# Patient Record
Sex: Female | Born: 1990
Health system: Southern US, Community
[De-identification: ages and names within clinical notes are randomized; demographics above are authoritative.]

## PROBLEM LIST (undated history)

## (undated) DIAGNOSIS — F419 Anxiety disorder, unspecified: Secondary | ICD-10-CM

## (undated) DIAGNOSIS — T7840XA Allergy, unspecified, initial encounter: Secondary | ICD-10-CM

## (undated) DIAGNOSIS — F988 Other specified behavioral and emotional disorders with onset usually occurring in childhood and adolescence: Secondary | ICD-10-CM

## (undated) DIAGNOSIS — F32A Depression, unspecified: Secondary | ICD-10-CM

## (undated) HISTORY — PX: FRACTURE SURGERY: SHX138

## (undated) HISTORY — DX: Depression, unspecified: F32.A

## (undated) HISTORY — DX: Allergy, unspecified, initial encounter: T78.40XA

---
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
## 2011-04-24 LAB — URINALYSIS, ROUTINE W REFLEX MICROSCOPIC
Bilirubin Urine: NEGATIVE
Glucose, UA: NEGATIVE
Hgb urine dipstick: NEGATIVE
Ketones, ur: NEGATIVE
Nitrite: NEGATIVE
Protein, ur: NEGATIVE
Specific Gravity, Urine: 1.005
Urobilinogen, UA: 0.2
pH: 6

## 2011-04-24 LAB — COMPREHENSIVE METABOLIC PANEL
BUN: 10
CO2: 26
Chloride: 107
Creatinine, Ser: 0.69
Total Bilirubin: 0.9

## 2011-04-24 LAB — CBC
HCT: 39.2
Hemoglobin: 13.3
MCV: 86.8
RBC: 4.51
WBC: 10.2

## 2011-04-24 LAB — DIFFERENTIAL
Basophils Absolute: 0
Basophils Relative: 0
Eosinophils Relative: 1
Lymphocytes Relative: 27
Neutro Abs: 6.8

## 2011-04-24 LAB — URINE CULTURE: Colony Count: NO GROWTH

## 2011-05-12 LAB — POCT HEMOGLOBIN-HEMACUE: Operator id: 123881

## 2015-12-18 DIAGNOSIS — L259 Unspecified contact dermatitis, unspecified cause: Secondary | ICD-10-CM | POA: Diagnosis not present

## 2016-01-21 DIAGNOSIS — Z6834 Body mass index (BMI) 34.0-34.9, adult: Secondary | ICD-10-CM | POA: Diagnosis not present

## 2016-01-21 DIAGNOSIS — Z01419 Encounter for gynecological examination (general) (routine) without abnormal findings: Secondary | ICD-10-CM | POA: Diagnosis not present

## 2016-01-21 DIAGNOSIS — Z124 Encounter for screening for malignant neoplasm of cervix: Secondary | ICD-10-CM | POA: Diagnosis not present

## 2016-02-11 DIAGNOSIS — T7840XA Allergy, unspecified, initial encounter: Secondary | ICD-10-CM | POA: Diagnosis not present

## 2016-03-20 DIAGNOSIS — L299 Pruritus, unspecified: Secondary | ICD-10-CM | POA: Diagnosis not present

## 2016-03-20 DIAGNOSIS — X58XXXA Exposure to other specified factors, initial encounter: Secondary | ICD-10-CM | POA: Diagnosis not present

## 2016-03-20 DIAGNOSIS — Z720 Tobacco use: Secondary | ICD-10-CM | POA: Diagnosis not present

## 2016-03-20 DIAGNOSIS — T7840XA Allergy, unspecified, initial encounter: Secondary | ICD-10-CM | POA: Diagnosis not present

## 2016-03-20 DIAGNOSIS — H578 Other specified disorders of eye and adnexa: Secondary | ICD-10-CM | POA: Diagnosis not present

## 2016-03-20 DIAGNOSIS — T783XXA Angioneurotic edema, initial encounter: Secondary | ICD-10-CM | POA: Diagnosis not present

## 2016-03-20 DIAGNOSIS — R220 Localized swelling, mass and lump, head: Secondary | ICD-10-CM | POA: Diagnosis not present

## 2016-04-12 ENCOUNTER — Emergency Department (HOSPITAL_COMMUNITY)
Admission: EM | Admit: 2016-04-12 | Discharge: 2016-04-13 | Disposition: A | Discharge status: Home / Self Care | Payer: BLUE CROSS/BLUE SHIELD | Attending: Emergency Medicine | Admitting: Emergency Medicine

## 2016-04-12 ENCOUNTER — Encounter (HOSPITAL_COMMUNITY): Payer: Self-pay | Admitting: Emergency Medicine

## 2016-04-12 DIAGNOSIS — F17210 Nicotine dependence, cigarettes, uncomplicated: Secondary | ICD-10-CM | POA: Insufficient documentation

## 2016-04-12 DIAGNOSIS — T7840XA Allergy, unspecified, initial encounter: Secondary | ICD-10-CM

## 2016-04-12 MED ORDER — METHYLPREDNISOLONE SODIUM SUCC 125 MG IJ SOLR
125.0000 mg | Freq: Once | INTRAMUSCULAR | Status: AC
  Administered 2016-04-12: 125 mg via INTRAVENOUS
  Filled 2016-04-12: qty 2

## 2016-04-12 MED ORDER — FAMOTIDINE IN NACL 20-0.9 MG/50ML-% IV SOLN
20.0000 mg | Freq: Once | INTRAVENOUS | Status: AC
  Administered 2016-04-12: 20 mg via INTRAVENOUS
  Filled 2016-04-12: qty 50

## 2016-04-12 MED ORDER — DIPHENHYDRAMINE HCL 50 MG/ML IJ SOLN
25.0000 mg | Freq: Once | INTRAMUSCULAR | Status: AC
  Administered 2016-04-12: 25 mg via INTRAVENOUS
  Filled 2016-04-12: qty 1

## 2016-04-12 MED ORDER — PREDNISONE 50 MG PO TABS: ORAL_TABLET | ORAL | 0 refills | Status: DC

## 2016-04-12 NOTE — Discharge Instructions (Signed)
You were given Solu-Medrol 125 mg IV, Benadryl 25 mg IV, Pepcid 20 mg IV.   Discharge medication prednisone 50 mg daily. Call the allergist Monday and discuss your situation.

## 2016-04-12 NOTE — ED Triage Notes (Signed)
Pt here with unknown allergic rxn starting tonight fter dinner. Pt took 75mg  benadryl PTA. Pt has hives and facial swelling. Airway intact, no edema to throat. Resp e/u. Pt has allergy test Thursday.

## 2016-04-12 NOTE — ED Provider Notes (Signed)
[facility] Provider Note   CSN: [id number] Arrival date & time: 04/12/16  2220     History   Chief Complaint Chief Complaint  Patient presents with  . Allergic Reaction    HPI ANYIA GIERKE is a 25 y.o. female.  Lip swelling, facial swelling, pruritic rash to extremities for several hours.  Patient had a similar episode approximately 2 weeks ago requiring ED treatment. She has an appointment with an allergist on Thursday. This has never happened before. She is swallowing normally and breathing without excessive exertion. No known allergens or inciting events.       History reviewed. No pertinent past medical history.  There are no active problems to display for this patient.   History reviewed. No pertinent surgical history.  OB History    No data available       Home Medications    Prior to Admission medications   Medication Sig Start Date End Date Taking? Authorizing Provider  EPINEPHrine 0.3 mg/0.3 mL IJ SOAJ injection Inject 0.3 mg into the muscle daily as needed (allergic reaction).   Yes Historical Provider, MD  norethindrone-ethinyl estradiol (JUNEL FE,GILDESS FE,LOESTRIN FE) 1-20 MG-MCG tablet Take 1 tablet by mouth daily.   Yes Historical Provider, MD  predniSONE (DELTASONE) 50 MG tablet 1 tablet daily for 7 days 04/12/16   Donnetta Hutching, MD    Family History History reviewed. No pertinent family history.  Social History Social History  Substance Use Topics  . Smoking status: Current Every Day Smoker    Packs/day: 0.50    Types: Cigarettes  . Smokeless tobacco: Never Used  . Alcohol use Yes     Comment: occasional     Allergies   Nsaids   Review of Systems Review of Systems  All other systems reviewed and are negative.    Physical Exam Updated Vital Signs BP 115/76   Pulse 94   Temp 98.1 F (36.7 C) (Oral)   Resp 20   Ht 5\' 5"  (1.651 m)   Wt 190 lb (86.2 kg)   LMP 04/10/2016   SpO2 99%   BMI 31.62 kg/m   Physical  Exam  Constitutional: She is oriented to person, place, and time. She appears well-developed and well-nourished.  HENT:  Head: Normocephalic and atraumatic.  Lips are edematous.  Normal airway.  Eyes: Conjunctivae are normal.  Neck: Neck supple.  Cardiovascular: Normal rate and regular rhythm.   Pulmonary/Chest: Effort normal and breath sounds normal.  Abdominal: Soft. Bowel sounds are normal.  Musculoskeletal: Normal range of motion.  Neurological: She is alert and oriented to person, place, and time.  Skin: Skin is warm and dry.  Wheals on skin  Psychiatric: She has a normal mood and affect. Her behavior is normal.  Nursing note and vitals reviewed.    ED Treatments / Results  Labs (all labs ordered are listed, but only abnormal results are displayed) Labs Reviewed - No data to display  EKG  EKG Interpretation None       Radiology No results found.  Procedures Procedures (including critical care time)  Medications Ordered in ED Medications  famotidine (PEPCID) IVPB 20 mg premix (20 mg Intravenous New Bag/Given 04/12/16 2300)  diphenhydrAMINE (BENADRYL) injection 25 mg (25 mg Intravenous Given 04/12/16 2257)  methylPREDNISolone sodium succinate (SOLU-MEDROL) 125 mg/2 mL injection 125 mg (125 mg Intravenous Given 04/12/16 2258)     Initial Impression / Assessment and Plan / ED Course  I have reviewed the triage vital signs and the  nursing notes.  Pertinent labs & imaging results that were available during my care of the patient were reviewed by me and considered in my medical decision making (see chart for details).  Clinical Course    Patient had good response to IV Solu-Medrol, IV Pepcid, IV Benadryl. Discharge medication prednisone 50 mg. She will follow-up with allergist.  Final Clinical Impressions(s) / ED Diagnoses   Final diagnoses:  Allergic reaction, initial encounter    New Prescriptions New Prescriptions   PREDNISONE (DELTASONE) 50 MG TABLET    1  tablet daily for 7 days     Donnetta HutchingBrian Alice Vitelli, MD 04/12/16 2351

## 2016-04-13 NOTE — ED Notes (Signed)
Pt verbalized understanding of d/cf instructions and has no further questions. Pt's airway intact, breath sounds clear, lip swelling has gone down, hives are gone. Pt to take prednisone once per day and then call the allergist on Monday to discuss situation about allergy testing appointment Thursday.

## 2016-04-17 DIAGNOSIS — T783XXD Angioneurotic edema, subsequent encounter: Secondary | ICD-10-CM | POA: Diagnosis not present

## 2016-04-17 DIAGNOSIS — L508 Other urticaria: Secondary | ICD-10-CM | POA: Diagnosis not present

## 2016-10-12 DIAGNOSIS — T7840XA Allergy, unspecified, initial encounter: Secondary | ICD-10-CM | POA: Diagnosis not present

## 2016-10-16 DIAGNOSIS — T783XXD Angioneurotic edema, subsequent encounter: Secondary | ICD-10-CM | POA: Diagnosis not present

## 2016-10-16 DIAGNOSIS — L508 Other urticaria: Secondary | ICD-10-CM | POA: Diagnosis not present

## 2017-01-06 LAB — HM PAP SMEAR

## 2017-01-06 LAB — HIV ANTIBODY (ROUTINE TESTING W REFLEX): HIV 1&2 Ab, 4th Generation: NEGATIVE

## 2017-01-22 DIAGNOSIS — T783XXD Angioneurotic edema, subsequent encounter: Secondary | ICD-10-CM | POA: Diagnosis not present

## 2017-01-22 DIAGNOSIS — Z87892 Personal history of anaphylaxis: Secondary | ICD-10-CM | POA: Diagnosis not present

## 2017-01-22 DIAGNOSIS — Z01419 Encounter for gynecological examination (general) (routine) without abnormal findings: Secondary | ICD-10-CM | POA: Diagnosis not present

## 2017-01-22 DIAGNOSIS — Z6835 Body mass index (BMI) 35.0-35.9, adult: Secondary | ICD-10-CM | POA: Diagnosis not present

## 2017-01-22 DIAGNOSIS — R87610 Atypical squamous cells of undetermined significance on cytologic smear of cervix (ASC-US): Secondary | ICD-10-CM | POA: Diagnosis not present

## 2017-01-22 DIAGNOSIS — L508 Other urticaria: Secondary | ICD-10-CM | POA: Diagnosis not present

## 2017-02-04 DIAGNOSIS — R87610 Atypical squamous cells of undetermined significance on cytologic smear of cervix (ASC-US): Secondary | ICD-10-CM | POA: Diagnosis not present

## 2017-02-20 DIAGNOSIS — X58XXXA Exposure to other specified factors, initial encounter: Secondary | ICD-10-CM | POA: Diagnosis not present

## 2017-02-20 DIAGNOSIS — N939 Abnormal uterine and vaginal bleeding, unspecified: Secondary | ICD-10-CM | POA: Diagnosis not present

## 2017-02-20 DIAGNOSIS — F17210 Nicotine dependence, cigarettes, uncomplicated: Secondary | ICD-10-CM | POA: Diagnosis not present

## 2017-02-20 DIAGNOSIS — S3141XA Laceration without foreign body of vagina and vulva, initial encounter: Secondary | ICD-10-CM | POA: Diagnosis not present

## 2017-02-23 DIAGNOSIS — S3141XA Laceration without foreign body of vagina and vulva, initial encounter: Secondary | ICD-10-CM | POA: Diagnosis not present

## 2017-03-12 DIAGNOSIS — S3141XD Laceration without foreign body of vagina and vulva, subsequent encounter: Secondary | ICD-10-CM | POA: Diagnosis not present

## 2017-07-08 ENCOUNTER — Encounter: Payer: Self-pay | Admitting: Physician Assistant

## 2017-07-08 ENCOUNTER — Ambulatory Visit: Payer: BLUE CROSS/BLUE SHIELD | Admitting: Physician Assistant

## 2017-07-08 VITALS — BP 124/76 | HR 72 | Temp 97.7°F | Resp 18 | Ht 65.0 in | Wt 223.8 lb

## 2017-07-08 DIAGNOSIS — Z131 Encounter for screening for diabetes mellitus: Secondary | ICD-10-CM

## 2017-07-08 DIAGNOSIS — E559 Vitamin D deficiency, unspecified: Secondary | ICD-10-CM | POA: Diagnosis not present

## 2017-07-08 DIAGNOSIS — Z0000 Encounter for general adult medical examination without abnormal findings: Secondary | ICD-10-CM | POA: Diagnosis not present

## 2017-07-08 DIAGNOSIS — Z130 Encounter for screening for diseases of the blood and blood-forming organs and certain disorders involving the immune mechanism: Secondary | ICD-10-CM

## 2017-07-08 DIAGNOSIS — Z79899 Other long term (current) drug therapy: Secondary | ICD-10-CM | POA: Diagnosis not present

## 2017-07-08 DIAGNOSIS — F419 Anxiety disorder, unspecified: Secondary | ICD-10-CM

## 2017-07-08 DIAGNOSIS — Z23 Encounter for immunization: Secondary | ICD-10-CM | POA: Diagnosis not present

## 2017-07-08 DIAGNOSIS — R87619 Unspecified abnormal cytological findings in specimens from cervix uteri: Secondary | ICD-10-CM | POA: Insufficient documentation

## 2017-07-08 DIAGNOSIS — E0789 Other specified disorders of thyroid: Secondary | ICD-10-CM

## 2017-07-08 DIAGNOSIS — I10 Essential (primary) hypertension: Secondary | ICD-10-CM

## 2017-07-08 DIAGNOSIS — R002 Palpitations: Secondary | ICD-10-CM

## 2017-07-08 DIAGNOSIS — Z136 Encounter for screening for cardiovascular disorders: Secondary | ICD-10-CM | POA: Diagnosis not present

## 2017-07-08 DIAGNOSIS — Z720 Tobacco use: Secondary | ICD-10-CM

## 2017-07-08 DIAGNOSIS — Z13220 Encounter for screening for lipoid disorders: Secondary | ICD-10-CM

## 2017-07-08 DIAGNOSIS — Z6837 Body mass index (BMI) 37.0-37.9, adult: Secondary | ICD-10-CM

## 2017-07-08 DIAGNOSIS — R87610 Atypical squamous cells of undetermined significance on cytologic smear of cervix (ASC-US): Secondary | ICD-10-CM

## 2017-07-08 DIAGNOSIS — L509 Urticaria, unspecified: Secondary | ICD-10-CM

## 2017-07-08 MED ORDER — BUPROPION HCL ER (XL) 150 MG PO TB24: 150.0000 mg | ORAL_TABLET | ORAL | 2 refills | Status: DC

## 2017-07-08 MED ORDER — LEVOCETIRIZINE DIHYDROCHLORIDE 5 MG PO TABS: 5.0000 mg | ORAL_TABLET | Freq: Every evening | ORAL | 3 refills | Status: DC

## 2017-07-08 NOTE — Progress Notes (Signed)
Complete Physical  Assessment and Plan:  Needs flu shot -     FLU VACCINE MDCK QUAD W/Preservative  BMI 37.0-37.9, adult - follow up 3 months for progress monitoring - increase veggies, decrease carbs - long discussion about weight loss, diet, and exercise  Medication management -     CBC with Differential/Platelet -     BASIC METABOLIC PANEL WITH GFR -     Hepatic function panel -     Magnesium  Anxiety -     buPROPion (WELLBUTRIN XL) 150 MG 24 hr tablet; Take 1 tablet (150 mg total) by mouth every morning. - start new medication prescribed, stress management techniques discussed, increase water, good sleep hygiene discussed, increase exercise, and increase veggies.  call the office if any new AE's from medications and we will switch them.   Tobacco use Smoking cessation-  instruction/counseling given, counseled patient on the dangers of tobacco use, advised patient to stop smoking, and reviewed strategies to maximize success, will try Wellbutrin for cessation/anxiety  Palpitation  check labs, r/u anemia, TSH, dehydration, electrolytes imbalance  if worse will go to ER, follow up cardio Increase water, decrease alcohol/caffeine, valsalva maneuvers taught -     TSH -     Sedimentation rate -     EKG 12-Lead  Thyroid fullness -     TSH -     Sedimentation rate -     US THYROID; Future  Screening for diabetes mellitus -     Hemoglobin A1c -     Insulin, random  Screening cholesterol level -     Lipid panel  Screening, anemia, deficiency, iron -     Iron,Total/Total Iron Binding Cap -     Vitamin B12  Vitamin D deficiency -     VITAMIN D 25 Hydroxy (Vit-D Deficiency, Fractures)  Hives of unknown origin -     levocetirizine (XYZAL) 5 MG tablet; Take 1 tablet (5 mg total) by mouth every evening.    Discussed med's effects and SE's. Screening labs and tests as requested with regular follow-up as recommended. Over 40 minutes of exam, counseling, chart review, and  complex, high level critical decision making was performed this visit.   HPI  26 y.o. female  presents for a complete physical as a new patient.   Her blood pressure has been controlled at home, today their BP is BP: 124/76 She does not workout. She denies chest pain, shortness of breath, dizziness.   She is on vitamin D vitamind daily 1000 IU daily.   Had bite from [place] tick and since that time will have random hives, swelling. Carried epipen for this but has not had to use it and take continuous zyrtec.  Mother wanted her to come in today, mom states she has been very anxious, crying a lot. Has had anxiety x 1 year. Will have a lot of worry, decreased motivation, increase weight. She will get clammy, figity, and palpitations. Will take several deep breaths and will last 10 mins and go away.   Will have bilateral hand pain, has history of RA with GM and aunt.   Follows with GYN, Lyn Hurst in [place]. PAP  01/22/2017 + high rish HPV, abnormal pap ASC-US.  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   BMI is Body mass index is 37.24 kg/m., she is working on diet and exercise. Wt Readings from Last 3 Encounters:  07/08/17 223 lb 12.8 oz (101.5 kg)  04/12/16 190 lb (86.2 kg)  Current Medications:  Current Outpatient Medications on File Prior to Visit  Medication Sig Dispense Refill  . cetirizine (ZYRTEC) 10 MG tablet Take 10 mg by mouth daily.    Marland Kitchen. EPINEPHrine 0.3 mg/0.3 mL IJ SOAJ injection Inject 0.3 mg into the muscle daily as needed (allergic reaction).    . norethindrone-ethinyl estradiol (JUNEL FE,GILDESS FE,LOESTRIN FE) 1-20 MG-MCG tablet Take 1 tablet by mouth daily.     No current facility-administered medications on file prior to visit.    Allergies:  Allergies  Allergen Reactions  . Nsaids Hives and Other (See Comments)    blisters   Medical History:  She has Vitamin D deficiency; Tobacco use; Anxiety; Medication management; BMI 37.0-37.9, adult; and Abnormal Pap smear  of cervix on their problem list.   Health Maintenance:   Immunization History  Administered Date(s) Administered  . Influenza Inj Mdck Quad With Preservative 07/08/2017    Tetanus: Sept 2017 Pneumovax: Prevnar 13:  Flu vaccine: TODAY Zostavax:  Patient's last menstrual period was 06/24/2017. Pap: 12/2016 at GYN + HPV, ASCUS MGM:  DEXA: Colonoscopy: EGD:  Patient Care Team: Lucky CowboyMcKeown, William, MD as PCP - General (Internal Medicine)  Surgical History:  She has a past surgical history that includes Knee surgery (Left, 2006).   Family History:  Herfamily history includes Rheum arthritis in her maternal aunt and maternal grandmother.  States no significant family history.   Social History:  She reports that she has been smoking cigarettes.  She has a 1.25 pack-year smoking history. she has never used smokeless tobacco. She reports that she drinks about 7.2 oz of alcohol per week. She reports that she does not use drugs.  Lives with boyfriend, 3 step kids, 539 boy, girl kelsy 7, 4 boy- lives in [place]  Review of Systems: Review of Systems  Constitutional: Negative.   HENT: Negative.   Eyes: Negative.   Respiratory: Negative.   Cardiovascular: Positive for palpitations. Negative for chest pain, orthopnea, claudication, leg swelling and PND.  Gastrointestinal: Negative.   Genitourinary: Negative.   Musculoskeletal: Positive for joint pain. Negative for back pain, falls, myalgias and neck pain.  Skin: Positive for itching and rash.  Neurological: Negative.   Psychiatric/Behavioral: Negative for depression, hallucinations, memory loss, substance abuse and suicidal ideas. The patient is nervous/anxious. The patient does not have insomnia.     Physical Exam: Estimated body mass index is 37.24 kg/m as calculated from the following:   Height as of this encounter: 5\' 5"  (1.651 m).   Weight as of this encounter: 223 lb 12.8 oz (101.5 kg). BP 124/76   Pulse 72   Temp 97.7 F  (36.5 C)   Resp 18   Ht 5\' 5"  (1.651 m)   Wt 223 lb 12.8 oz (101.5 kg)   LMP 06/24/2017   SpO2 98%   BMI 37.24 kg/m  General Appearance: Well nourished, in no apparent distress.  Eyes: PERRLA, EOMs, conjunctiva no swelling or erythema, normal fundi and vessels.  Sinuses: No Frontal/maxillary tenderness  ENT/Mouth: Ext aud canals clear, normal light reflex with TMs without erythema, bulging. Good dentition. No erythema, swelling, or exudate on post pharynx. Tonsils not swollen or erythematous. Hearing normal.  Neck: Supple, thyroid fullness but no nodules. No bruits  Respiratory: Respiratory effort normal, BS equal bilaterally without rales, rhonchi, wheezing or stridor.  Cardio: RRR without murmurs, rubs or gallops. Brisk peripheral pulses without edema.  Chest: symmetric, with normal excursions and percussion.  Breasts: defer Abdomen: Soft, nontender, no guarding, rebound,  hernias, masses, or organomegaly.  Lymphatics: Non tender without lymphadenopathy.  Genitourinary: defer Musculoskeletal: Full ROM all peripheral extremities,5/5 strength, and normal gait.  Skin: Warm, dry without rashes, lesions, ecchymosis. Neuro: Cranial nerves intact, reflexes equal bilaterally. Normal muscle tone, no cerebellar symptoms. Sensation intact.  Psych: Awake and oriented X 3, normal affect, Insight and Judgment appropriate.   EKG: WNL no ST changes.   Margaret MullingAmanda Rodrigus Mckinney 11:33 AM [facility]

## 2017-07-08 NOTE — Patient Instructions (Signed)
Being dehydrated can hurt your kidneys, cause fatigue, headaches, muscle aches, joint pain, and dry skin/nails so please increase your fluids.   Drink 80-100 oz a day of water, measure it out! Eat 3 meals a day, have to do breakfast, eat protein- hard boiled eggs, protein bar like nature valley protein bar, greek yogurt like oikos triple zero, chobani 100, or light n fit greek  Can check out plantnanny app on your phone to help you keep track of your water   We want weight loss that will last so you should lose 1-2 pounds a week.  THAT IS IT! Please pick THREE things a month to change. Once it is a habit check off the item. Then pick another three items off the list to become habits.  If you are already doing a habit on the list GREAT!  Cross that item off! o Don't drink your calories. Ie, alcohol, soda, fruit juice, and sweet tea.  o Drink more water. Drink a glass when you feel hungry or before each meal.  o Eat breakfast - Complex carb and protein (likeDannon light and fit yogurt, oatmeal, fruit, eggs, [place]urkey bacon). o Measure your cereal.  Eat no more than one cup a day. (ie [place]ashi) o Eat an apple a day. o Add a vegetable a day. o Try a new vegetable a month. o Use Pam! Stop using oil or butter to cook. o Don't finish your plate or use smaller plates. o Share your dessert. o Eat sugar free Jello for dessert or frozen grapes. o Don't eat 2-3 hours before bed. o Switch to whole wheat bread, pasta, and brown rice. o Make healthier choices when you eat out. No fries! o Pick baked chicken, NOT fried. o Don't forget to SLOW DOWN when you eat. It is not going anywhere.  o Take the stairs. o Park far away in the parking lot o [employer] Farm[employer] soup cans (or weights) for 10 minutes while watching TV. o Walk at work for 10 minutes during break. o Walk outside 1 time a week with your friend, kids, dog, or significant other. o Start a walking group at church. o Walk the mall as much as you can tolerate.   o Keep a food diary. o Weigh yourself daily. o Walk for 15 minutes 3 days per week. o Cook at home more often and eat out less.  If life happens and you go back to old habits, it is okay.  Just start over. You can do it!   If you experience chest pain, get short of breath, or tired during the exercise, please stop immediately and inform your doctor.    Vitamin D goal is between 60-80  Please make sure that you are taking your Vitamin D as directed.   It is very important as a natural anti-inflammatory   helping hair, skin, and nails, as well as reducing stroke and heart attack risk.   It helps your bones and helps with mood.  We want you on at least 5000 IU daily  It also decreases numerous cancer risks so please take it as directed.   Low Vit D is associated with a 200-300% higher risk for CANCER   and 200-300% higher risk for HEART   ATTACK  &  STROKE.    .....................................Marland Kitchen.  It is also associated with higher death rate at younger ages,   autoimmune diseases like Rheumatoid arthritis, Lupus, Multiple Sclerosis.     Also many other serious conditions, like depression,  Alzheimer's  Dementia, infertility, muscle aches, fatigue, fibromyalgia - just to name a few.  +++++++++++++++++++  Can get liquid vitamin D from [place]  OR [place] at  [facility] [address], [place], [place] [id number] Or you can try earth fare

## 2017-07-09 LAB — BASIC METABOLIC PANEL WITH GFR
BUN: 11 mg/dL (ref 7–25)
CALCIUM: 9.5 mg/dL (ref 8.6–10.2)
CHLORIDE: 104 mmol/L (ref 98–110)
CO2: 27 mmol/L (ref 20–32)
Creat: 0.75 mg/dL (ref 0.50–1.10)
GFR, EST AFRICAN AMERICAN: 128 mL/min/{1.73_m2} (ref >=60)
GFR, Est Non African American: 111 mL/min/{1.73_m2} (ref >=60)
Glucose, Bld: 93 mg/dL (ref 65–99)
POTASSIUM: 4.5 mmol/L (ref 3.5–5.3)
Sodium: 139 mmol/L (ref 135–146)

## 2017-07-09 LAB — VITAMIN B12: VITAMIN B 12: 272 pg/mL (ref 200–1100)

## 2017-07-09 LAB — CBC WITH DIFFERENTIAL/PLATELET
Basophils Absolute: 39 cells/uL (ref 0–200)
Basophils Relative: 0.5 %
Eosinophils Absolute: 77 cells/uL (ref 15–500)
Eosinophils Relative: 1 %
HCT: 38.9 % (ref 35.0–45.0)
Hemoglobin: 13.1 g/dL (ref 11.7–15.5)
LYMPHS ABS: 1740 {cells}/uL (ref 850–3900)
MCH: 29.5 pg (ref 27.0–33.0)
MCHC: 33.7 g/dL (ref 32.0–36.0)
MCV: 87.6 fL (ref 80.0–100.0)
MONOS PCT: 5.3 %
MPV: 10.7 fL (ref 7.5–12.5)
NEUTROS ABS: 5436 {cells}/uL (ref 1500–7800)
NEUTROS PCT: 70.6 %
PLATELETS: 325 10*3/uL (ref 140–400)
RBC: 4.44 10*6/uL (ref 3.80–5.10)
RDW: 11.4 % (ref 11.0–15.0)
TOTAL LYMPHOCYTE: 22.6 %
WBC: 7.7 10*3/uL (ref 3.8–10.8)
WBCMIX: 408 {cells}/uL (ref 200–950)

## 2017-07-09 LAB — HEPATIC FUNCTION PANEL
AG Ratio: 1.5 (calc) (ref 1.0–2.5)
ALT: 14 U/L (ref 6–29)
AST: 16 U/L (ref 10–30)
Albumin: 4.3 g/dL (ref 3.6–5.1)
Alkaline phosphatase (APISO): 51 U/L (ref 33–115)
BILIRUBIN DIRECT: 0.1 mg/dL (ref 0.0–0.2)
Globulin: 2.8 g/dL (calc) (ref 1.9–3.7)
Indirect Bilirubin: 0.3 mg/dL (calc) (ref 0.2–1.2)
Total Bilirubin: 0.4 mg/dL (ref 0.2–1.2)
Total Protein: 7.1 g/dL (ref 6.1–8.1)

## 2017-07-09 LAB — LIPID PANEL
Cholesterol: 165 mg/dL (ref <200)
HDL: 78 mg/dL (ref >50)
LDL Cholesterol (Calc): 73 mg/dL (calc)
Non-HDL Cholesterol (Calc): 87 mg/dL (calc) (ref <130)
Total CHOL/HDL Ratio: 2.1 (calc) (ref <5.0)
Triglycerides: 63 mg/dL (ref <150)

## 2017-07-09 LAB — SEDIMENTATION RATE: Sed Rate: 14 mm/h (ref 0–20)

## 2017-07-09 LAB — INSULIN, RANDOM: Insulin: 5 u[IU]/mL (ref 2.0–19.6)

## 2017-07-09 LAB — VITAMIN D 25 HYDROXY (VIT D DEFICIENCY, FRACTURES): VIT D 25 HYDROXY: 19 ng/mL — AB (ref 30–100)

## 2017-07-09 LAB — IRON, TOTAL/TOTAL IRON BINDING CAP
%SAT: 17 % (calc) (ref 11–50)
IRON: 65 ug/dL (ref 40–190)
TIBC: 382 mcg/dL (calc) (ref 250–450)

## 2017-07-09 LAB — HEMOGLOBIN A1C
Hgb A1c MFr Bld: 5.1 % of total Hgb (ref <5.7)
Mean Plasma Glucose: 100 (calc)
eAG (mmol/L): 5.5 (calc)

## 2017-07-09 LAB — MAGNESIUM: MAGNESIUM: 1.9 mg/dL (ref 1.5–2.5)

## 2017-07-09 LAB — TSH: TSH: 0.96 m[IU]/L

## 2017-07-28 HISTORY — PX: LEEP: SHX91

## 2017-07-28 HISTORY — PX: HAND SURGERY: SHX662

## 2017-08-12 ENCOUNTER — Other Ambulatory Visit: Payer: Self-pay | Admitting: Physician Assistant

## 2017-08-12 ENCOUNTER — Encounter: Payer: Self-pay | Admitting: Physician Assistant

## 2017-08-12 MED ORDER — BUPROPION HCL ER (XL) 300 MG PO TB24: 300.0000 mg | ORAL_TABLET | ORAL | 2 refills | Status: DC

## 2017-08-20 ENCOUNTER — Other Ambulatory Visit: Payer: BLUE CROSS/BLUE SHIELD

## 2017-08-21 DIAGNOSIS — L500 Allergic urticaria: Secondary | ICD-10-CM | POA: Diagnosis not present

## 2017-08-29 ENCOUNTER — Encounter: Payer: Self-pay | Admitting: Physician Assistant

## 2017-08-29 DIAGNOSIS — T783XXD Angioneurotic edema, subsequent encounter: Secondary | ICD-10-CM

## 2017-09-01 NOTE — Addendum Note (Signed)
Addended by: Quentin MullingOLLIER, Cheyenna Pankowski R on: 09/01/2017 07:25 AM   Modules accepted: Orders

## 2017-09-14 ENCOUNTER — Encounter: Payer: Self-pay | Admitting: Physician Assistant

## 2017-09-15 ENCOUNTER — Ambulatory Visit
Admission: RE | Admit: 2017-09-15 | Discharge: 2017-09-15 | Disposition: A | Discharge status: Home / Self Care | Payer: BLUE CROSS/BLUE SHIELD | Source: Ambulatory Visit | Attending: Physician Assistant | Admitting: Physician Assistant

## 2017-09-15 DIAGNOSIS — E079 Disorder of thyroid, unspecified: Secondary | ICD-10-CM | POA: Diagnosis not present

## 2017-09-15 DIAGNOSIS — E0789 Other specified disorders of thyroid: Secondary | ICD-10-CM

## 2017-09-17 ENCOUNTER — Telehealth: Payer: Self-pay

## 2017-09-17 NOTE — Telephone Encounter (Signed)
Pt's mother reports pt is breaking out in hives & having trouble breathing & would like a referral.  Per provider pt needs to go to the ER due to breathing issues or concerns. We can also start the referral & pt still needs to come into the office after ER visit.  LVM with all the info. & asked pt to return office call. Feb 21st 2019 by DD

## 2017-09-29 ENCOUNTER — Encounter: Payer: Self-pay | Admitting: Physician Assistant

## 2017-09-29 ENCOUNTER — Ambulatory Visit (INDEPENDENT_AMBULATORY_CARE_PROVIDER_SITE_OTHER): Payer: BLUE CROSS/BLUE SHIELD | Admitting: Physician Assistant

## 2017-09-29 VITALS — BP 114/84 | HR 94 | Temp 98.1°F | Ht 65.0 in | Wt 230.6 lb

## 2017-09-29 DIAGNOSIS — T783XXA Angioneurotic edema, initial encounter: Secondary | ICD-10-CM

## 2017-09-29 DIAGNOSIS — M2550 Pain in unspecified joint: Secondary | ICD-10-CM | POA: Diagnosis not present

## 2017-09-29 DIAGNOSIS — R5383 Other fatigue: Secondary | ICD-10-CM | POA: Diagnosis not present

## 2017-09-29 DIAGNOSIS — L509 Urticaria, unspecified: Secondary | ICD-10-CM | POA: Diagnosis not present

## 2017-09-29 NOTE — Progress Notes (Signed)
Subjective:    Patient ID: Margaret Mckinney, female    DOB: 09-Jan-1991, 27 y.o.   MRN: [id number]  HPI 27 y.o. WF presents with recurrent urticaria and angioedema x 2 years in April.  She has seen allergies in the past at allergy center in [place]. Thought at first was from [place] tick but found out allergies to dust mites, cedar pollen.  She has not noticed a pattern with the hives/urticaria. She has not had an autoimmune work up. She states it can happen in her sleep, happen at any time. Will go from "zero to 100" in 5 mins.Denies tongue swelling, has lip/cheek swelling. She has an epi pen but has not had to use it yet. She is on xyzal. Has hives at this time.  Has been on wellbutrin x Dec.  She complains of fatigue, HA, joint pain in her hands, elbow, and knees occ. Swelling in her hands, worse with a flare but can happen at afterwards or longer. She denies wheezing unless with a flare at times, she denies new food, meds, detergents. No SOB, CP, weakness, numbness/tingling, dizziness, changes in vision, GI symptoms.   Blood pressure 114/84, pulse 94, temperature 98.1 F (36.7 C), height 5\' 5"  (1.651 m), weight 230 lb 9.6 oz (104.6 kg), SpO2 97 %.  Medications Current Outpatient Medications on File Prior to Visit  Medication Sig  . buPROPion (WELLBUTRIN XL) 300 MG 24 hr tablet Take 1 tablet (300 mg total) by mouth every morning.  . cetirizine (ZYRTEC) 10 MG tablet Take 10 mg by mouth daily.  Marland Kitchen EPINEPHrine 0.3 mg/0.3 mL IJ SOAJ injection Inject 0.3 mg into the muscle daily as needed (allergic reaction).  Marland Kitchen levocetirizine (XYZAL) 5 MG tablet Take 1 tablet (5 mg total) by mouth every evening.  . norethindrone-ethinyl estradiol (JUNEL FE,GILDESS FE,LOESTRIN FE) 1-20 MG-MCG tablet Take 1 tablet by mouth daily.   No current facility-administered medications on file prior to visit.     Problem list She has Vitamin D deficiency; Tobacco use; Anxiety; Medication management; BMI 37.0-37.9,  adult; and Abnormal Pap smear of cervix on their problem list.   Review of Systems  Constitutional: Positive for fatigue. Negative for chills and fever.  HENT: Negative.   Eyes: Negative for visual disturbance.  Respiratory: Negative.   Cardiovascular: Negative.   Gastrointestinal: Negative.  Negative for diarrhea.  Genitourinary: Negative.   Musculoskeletal: Positive for arthralgias.  Skin: Positive for rash. Negative for color change, pallor and wound.  Neurological: Positive for headaches. Negative for dizziness, facial asymmetry and numbness.       Objective:   Physical Exam  Constitutional: She is oriented to person, place, and time. She appears well-developed and well-nourished.  HENT:  Head: Normocephalic and atraumatic.  Eyes: Conjunctivae are normal. Pupils are equal, round, and reactive to light.  Neck: Normal range of motion. Neck supple.  Cardiovascular: Normal rate and regular rhythm.  Pulmonary/Chest: Effort normal and breath sounds normal.  Abdominal: Soft. Bowel sounds are normal. There is no tenderness.  Musculoskeletal: Normal range of motion. She exhibits no edema or tenderness.  Lymphadenopathy:    She has no cervical adenopathy.  Neurological: She is alert and oriented to person, place, and time. She has normal reflexes.  Skin: Skin is warm and dry. Rash noted.  Patient has erythematous raised welps along right axilla, down right arm to hand, and along lower back      Assessment & Plan:  Margaret Mckinney was seen today for urticaria. She  is 27 years old, has multitude of symptoms including recurrent urticaria with angioedema, she has had bilateral hand pain with swelling in hands and wrist, knee pain, HA and fatigue. Will get autoimmune labs, check for hereditary angioedema, tick borne illnesses Continue xyzal, patient has epi pen.   Urticaria -     C3 and C4 -     Complement, total -     Angiotensin converting enzyme -     Ehrlichia antibody panel -     B.  burgdorfi antibodies  Angioedema, initial encounter -     C3 and C4 -     Complement, total -     Angiotensin converting enzyme -     C1 Esterase Inhibitor, Functional  Polyarthralgia -     ANA -     Anti-DNA antibody, double-stranded -     Cyclic citrul peptide antibody, IgG -     Anti-Smith antibody -     RNP Antibody -     Ehrlichia antibody panel -     B. burgdorfi antibodies  Fatigue, unspecified type -     Thyroglobulin antibody -     Thyroid peroxidase antibody -     CBC with Differential/Platelet -     BASIC METABOLIC PANEL WITH GFR -     Hepatic function panel -     TSH

## 2017-10-01 ENCOUNTER — Other Ambulatory Visit: Payer: Self-pay | Admitting: Physician Assistant

## 2017-10-01 DIAGNOSIS — L509 Urticaria, unspecified: Secondary | ICD-10-CM

## 2017-10-01 DIAGNOSIS — R768 Other specified abnormal immunological findings in serum: Secondary | ICD-10-CM

## 2017-10-01 DIAGNOSIS — M2550 Pain in unspecified joint: Secondary | ICD-10-CM

## 2017-10-04 LAB — BASIC METABOLIC PANEL WITH GFR
BUN: 14 mg/dL (ref 7–25)
CALCIUM: 9.9 mg/dL (ref 8.6–10.2)
CO2: 26 mmol/L (ref 20–32)
Chloride: 103 mmol/L (ref 98–110)
Creat: 0.85 mg/dL (ref 0.50–1.10)
GFR, EST AFRICAN AMERICAN: 110 mL/min/{1.73_m2} (ref >=60)
GFR, EST NON AFRICAN AMERICAN: 95 mL/min/{1.73_m2} (ref >=60)
Glucose, Bld: 78 mg/dL (ref 65–99)
POTASSIUM: 4.4 mmol/L (ref 3.5–5.3)
SODIUM: 138 mmol/L (ref 135–146)

## 2017-10-04 LAB — C3 AND C4
C3 COMPLEMENT: 161 mg/dL (ref 83–193)
C4 Complement: 33 mg/dL (ref 15–57)

## 2017-10-04 LAB — CBC WITH DIFFERENTIAL/PLATELET
BASOS ABS: 28 {cells}/uL (ref 0–200)
Basophils Relative: 0.4 %
EOS ABS: 78 {cells}/uL (ref 15–500)
Eosinophils Relative: 1.1 %
HEMATOCRIT: 41.1 % (ref 35.0–45.0)
Hemoglobin: 14.1 g/dL (ref 11.7–15.5)
LYMPHS ABS: 1392 {cells}/uL (ref 850–3900)
MCH: 30.1 pg (ref 27.0–33.0)
MCHC: 34.3 g/dL (ref 32.0–36.0)
MCV: 87.8 fL (ref 80.0–100.0)
MPV: 10.6 fL (ref 7.5–12.5)
Monocytes Relative: 4.8 %
NEUTROS PCT: 74.1 %
Neutro Abs: 5261 cells/uL (ref 1500–7800)
Platelets: 346 10*3/uL (ref 140–400)
RBC: 4.68 10*6/uL (ref 3.80–5.10)
RDW: 11.4 % (ref 11.0–15.0)
TOTAL LYMPHOCYTE: 19.6 %
WBC: 7.1 10*3/uL (ref 3.8–10.8)
WBCMIX: 341 {cells}/uL (ref 200–950)

## 2017-10-04 LAB — ANTI-NUCLEAR AB-TITER (ANA TITER): ANA Titer 1: 1:80 {titer} — ABNORMAL HIGH

## 2017-10-04 LAB — COMPLEMENT, TOTAL: Compl, Total (CH50): >60 U/mL — ABNORMAL HIGH (ref 31–60)

## 2017-10-04 LAB — HEPATIC FUNCTION PANEL
AG Ratio: 1.6 (calc) (ref 1.0–2.5)
ALBUMIN MSPROF: 4.4 g/dL (ref 3.6–5.1)
ALT: 18 U/L (ref 6–29)
AST: 17 U/L (ref 10–30)
Alkaline phosphatase (APISO): 55 U/L (ref 33–115)
BILIRUBIN DIRECT: 0.1 mg/dL (ref 0.0–0.2)
BILIRUBIN INDIRECT: 0.3 mg/dL (ref 0.2–1.2)
BILIRUBIN TOTAL: 0.4 mg/dL (ref 0.2–1.2)
Globulin: 2.8 g/dL (calc) (ref 1.9–3.7)
Total Protein: 7.2 g/dL (ref 6.1–8.1)

## 2017-10-04 LAB — THYROID PEROXIDASE ANTIBODY

## 2017-10-04 LAB — C1 ESTERASE INHIBITOR, FUNCTIONAL: C1 Esterase Inhibitor Funct: >100 % (ref >=68)

## 2017-10-04 LAB — B. BURGDORFI ANTIBODIES: B burgdorferi Ab IgG+IgM: <0.9 index

## 2017-10-04 LAB — TSH: TSH: 1.31 mIU/L

## 2017-10-04 LAB — EHRLICHIA ANTIBODY PANEL

## 2017-10-04 LAB — ANTI-DNA ANTIBODY, DOUBLE-STRANDED: ds DNA Ab: <1 IU/mL

## 2017-10-04 LAB — ANA: ANA: POSITIVE — AB

## 2017-10-04 LAB — THYROGLOBULIN ANTIBODY: Thyroglobulin Ab: <1 IU/mL (ref <=1)

## 2017-10-04 LAB — ANTI-SMITH ANTIBODY: ENA SM Ab Ser-aCnc: <1 AI

## 2017-10-04 LAB — ANGIOTENSIN CONVERTING ENZYME: Angiotensin-Converting Enzyme: 16 U/L (ref 9–67)

## 2017-10-04 LAB — RNP ANTIBODY: RIBONUCLEIC PROTEIN(ENA) ANTIBODY, IGG: POSITIVE AI — AB

## 2017-10-04 LAB — CYCLIC CITRUL PEPTIDE ANTIBODY, IGG: Cyclic Citrullin Peptide Ab: <16 UNITS

## 2017-10-20 DIAGNOSIS — R875 Abnormal microbiological findings in specimens from female genital organs: Secondary | ICD-10-CM | POA: Diagnosis not present

## 2017-10-20 DIAGNOSIS — R87610 Atypical squamous cells of undetermined significance on cytologic smear of cervix (ASC-US): Secondary | ICD-10-CM | POA: Diagnosis not present

## 2017-10-23 NOTE — Progress Notes (Signed)
Office Visit Note  Patient: Margaret Mckinney             Date of Birth: September 07, 1990           MRN: [id number]             PCP: Unk Pinto, MD Referring: Vicie Mutters, PA-C Visit Date: 10/26/2017 Occupation: Waitress    Subjective:  No chief complaint on file.   History of Present Illness: Margaret Mckinney is a 27 y.o. female seen in consultation per request of her PCP.  According to her 2 years ago she started having hives on her face.  She states the hives were sporadic she had about 6 visits to the allergist in for visits to the emergency room.  She takes hydroxyzine which helps with the pruritus in the cold showers helps with the hives to calm down.  She had more episodes during the winter time.  She states recently she was seen by a new PCP who did lab work which was positive for autoimmune labs.  She also complains of discomfort in her hands, knee joints and left wrist joint.  She has noticed intermittent swelling in her left wrist joint.  Activities of Daily Living:  Patient reports morning stiffness for 0 minute.   Patient Denies nocturnal pain.  Difficulty dressing/grooming: Denies Difficulty climbing stairs: Denies Difficulty getting out of chair: Denies Difficulty using hands for taps, buttons, cutlery, and/or writing: Denies   Review of Systems  Constitutional: Positive for fatigue. Negative for night sweats, weight gain and weight loss.  HENT: Negative for mouth sores, trouble swallowing, trouble swallowing, mouth dryness and nose dryness.   Eyes: Negative for pain, redness, visual disturbance and dryness.  Respiratory: Negative for cough, shortness of breath and difficulty breathing.   Cardiovascular: Negative for chest pain, palpitations, hypertension, irregular heartbeat and swelling in legs/feet.  Gastrointestinal: Negative for blood in stool, constipation and diarrhea.  Endocrine: Negative for increased urination.  Genitourinary: Negative for vaginal  dryness.  Musculoskeletal: Positive for arthralgias, joint pain and joint swelling. Negative for myalgias, muscle weakness, morning stiffness, muscle tenderness and myalgias.  Skin: Negative for color change, rash, hair loss, skin tightness, ulcers and sensitivity to sunlight.  Allergic/Immunologic: Negative for susceptible to infections.  Neurological: Negative for dizziness, memory loss, night sweats and weakness.  Hematological: Negative for swollen glands.  Psychiatric/Behavioral: Negative for depressed mood and sleep disturbance. The patient is nervous/anxious.     PMFS History:  Patient Active Problem List   Diagnosis Date Noted  . Vitamin D deficiency 07/08/2017  . Tobacco use 07/08/2017  . Anxiety 07/08/2017  . Medication management 07/08/2017  . BMI 37.0-37.9, adult 07/08/2017  . Abnormal Pap smear of cervix 07/08/2017    History reviewed. No pertinent past medical history.  Family History  Problem Relation Age of Onset  . Healthy Mother   . Healthy Father   . Alopecia Brother   . Rheum arthritis Maternal Aunt   . Rheum arthritis Maternal Grandmother    Past Surgical History:  Procedure Laterality Date  . KNEE SURGERY Left 2006   Social History   Social History Narrative  . Not on file     Objective: Vital Signs: BP 117/83 (BP Location: Right Arm, Patient Position: Sitting, Cuff Size: Normal)   Pulse 78   Resp 17   Ht '5\' 4"'  (1.626 m)   Wt 229 lb (103.9 kg)   BMI 39.31 kg/m    Physical Exam  Constitutional: She is oriented  to person, place, and time. She appears well-developed and well-nourished.  HENT:  Head: Normocephalic and atraumatic.  Eyes: Conjunctivae and EOM are normal.  Neck: Normal range of motion.  Cardiovascular: Normal rate, regular rhythm, normal heart sounds and intact distal pulses.  Pulmonary/Chest: Effort normal and breath sounds normal.  Abdominal: Soft. Bowel sounds are normal.  Lymphadenopathy:    She has no cervical adenopathy.    Neurological: She is alert and oriented to person, place, and time.  Skin: Skin is warm and dry. Capillary refill takes less than 2 seconds.  Psychiatric: She has a normal mood and affect. Her behavior is normal.  Nursing note and vitals reviewed.    Musculoskeletal Exam: C-spine thoracic lumbar spine good range of motion.  Shoulder joints elbow joints wrist joint MCPs PIPs DIPs were in good range of motion.  Hip joints knee joints ankles MTPs PIPs are in good range of motion.  No synovitis was noted.  She has some stiffness with range of motion of her bilateral knee joints.  CDAI Exam: No CDAI exam completed.    Investigation: No additional findings. CBC Latest Ref Rng & Units 09/29/2017 07/08/2017 01/26/2008  WBC 3.8 - 10.8 Thousand/uL 7.1 7.7 10.2  Hemoglobin 11.7 - 15.5 g/dL 14.1 13.1 13.3  Hematocrit 35.0 - 45.0 % 41.1 38.9 39.2  Platelets 140 - 400 Thousand/uL 346 325 299   CMP Latest Ref Rng & Units 09/29/2017 07/08/2017 01/26/2008  Glucose 65 - 99 mg/dL 78 93 103(H)  BUN 7 - 25 mg/dL '14 11 10  ' Creatinine 0.50 - 1.10 mg/dL 0.85 0.75 0.69  Sodium 135 - 146 mmol/L 138 139 140  Potassium 3.5 - 5.3 mmol/L 4.4 4.5 3.8  Chloride 98 - 110 mmol/L 103 104 107  CO2 20 - 32 mmol/L '26 27 26  ' Calcium 8.6 - 10.2 mg/dL 9.9 9.5 9.4  Total Protein 6.1 - 8.1 g/dL 7.2 7.1 7.3  Total Bilirubin 0.2 - 1.2 mg/dL 0.4 0.4 0.9  Alkaline Phos - - - 89  AST 10 - 30 U/L '17 16 27  ' ALT 6 - 29 U/L '18 14 12  ' TSH normal, C1 esterase normal, thyroglobulin antibody negative thyroperoxidase antibodies negative, ESR 14, Lyme titer negative, Ehrlichia titer negative ANA 1: 80 speckled, dsDNA negative, RNP 1.6+, CH 50 normal, C3-C4 normal, anti-CCP negative, ACE normal January 06, 2017 HIV negative Imaging: Xr Hand 2 View Left  Result Date: 10/26/2017 No MCP PIP DIP narrowing was noted.  No intercarpal radiocarpal joint space narrowing was noted. Impression: Unremarkable x-ray of the hand.  Xr Hand 2 View  Right  Result Date: 10/26/2017 No MCP PIP DIP narrowing was noted.  No intercarpal radiocarpal joint space narrowing was noted. Impression: Unremarkable x-ray of the hand.  Xr Knee 3 View Left  Result Date: 10/26/2017 No knee joint space narrowing was noted.  No chondrocalcinosis was noted.  No patellofemoral narrowing was noted. Impression: Unremarkable x-ray of the knee joint.  Xr Knee 3 View Right  Result Date: 10/26/2017 No knee joint space narrowing was noted.  No chondrocalcinosis was noted.  No patellofemoral narrowing was noted. Impression: Unremarkable x-ray of the knee joint.   Speciality Comments: No specialty comments available.    Procedures:  No procedures performed Allergies: Nsaids   Assessment / Plan:     Visit Diagnoses: Positive ANA (antinuclear antibody) -patient has low titer positive ANA.  She also has positive family history of rheumatoid arthritis and 2 of her family members.  I do not  see any synovitis on examination.  The only symptoms she is experiencing are recurrent hives and fatigue.  I will obtain following labs to complete the autoimmune workup.  Plan: Urinalysis, Routine w reflex microscopic, Anti-scleroderma antibody, RNP Antibody, Anti-Smith antibody, Sjogrens syndrome-A extractable nuclear antibody, Sjogrens syndrome-B extractable nuclear antibody, Beta-2 glycoprotein antibodies, Cardiolipin antibodies, IgG, IgM, IgA, Lupus Anticoagulant Eval w/Reflex  Anti-RNP antibodies present  Pain in both hands -she gives history of intermittent swelling in her hands and discomfort.  She do not have any synovitis on examination today.  I will obtain x-rays and labs today.  Plan: XR Hand 2 View Right, XR Hand 2 View Left, Rheumatoid factor, 14-3-3 eta Protein  Urticaria -patient states that she has had allergy testing and also extensive workup by allergist.  Plan: Cryoglobulin  Chronic pain of both knees - Plan: XR KNEE 3 VIEW RIGHT, XR KNEE 3 VIEW LEFT  Other  fatigue - Plan: CK, Hepatitis B core antibody, IgM, Hepatitis B surface antigen, Hepatitis C antibody, Serum protein electrophoresis with reflex, IgG, IgA, IgM, QuantiFERON-TB Gold Plus, Glucose 6 phosphate dehydrogenase  Vitamin D deficiency  Tobacco use and patient states that she is trying to quit smoking.  History of anxiety  Family history of rheumatoid arthritis - Maternal grandmother and maternal aunt    Orders: Orders Placed This Encounter  Procedures  . XR Hand 2 View Right  . XR Hand 2 View Left  . XR KNEE 3 VIEW RIGHT  . XR KNEE 3 VIEW LEFT  . Urinalysis, Routine w reflex microscopic  . CK  . Anti-scleroderma antibody  . RNP Antibody  . Anti-Smith antibody  . Sjogrens syndrome-A extractable nuclear antibody  . Sjogrens syndrome-B extractable nuclear antibody  . Beta-2 glycoprotein antibodies  . Cardiolipin antibodies, IgG, IgM, IgA  . Lupus Anticoagulant Eval w/Reflex  . Rheumatoid factor  . Cryoglobulin  . 14-3-3 eta Protein  . Hepatitis B core antibody, IgM  . Hepatitis B surface antigen  . Hepatitis C antibody  . Serum protein electrophoresis with reflex  . IgG, IgA, IgM  . QuantiFERON-TB Gold Plus  . Glucose 6 phosphate dehydrogenase   No orders of the defined types were placed in this encounter.   Face-to-face time spent with patient was 60 minutes.  Greater than 50% of time was spent in counseling and coordination of care.  Follow-Up Instructions: No follow-ups on file.   Bo Merino, MD  Note - This record has been created using Editor, commissioning.  Chart creation errors have been sought, but may not always  have been located. Such creation errors do not reflect on  the standard of medical care.

## 2017-10-26 ENCOUNTER — Encounter: Payer: Self-pay | Admitting: Rheumatology

## 2017-10-26 ENCOUNTER — Encounter: Payer: Self-pay | Admitting: Physician Assistant

## 2017-10-26 ENCOUNTER — Ambulatory Visit: Payer: BLUE CROSS/BLUE SHIELD | Admitting: Rheumatology

## 2017-10-26 ENCOUNTER — Ambulatory Visit (INDEPENDENT_AMBULATORY_CARE_PROVIDER_SITE_OTHER): Payer: Self-pay

## 2017-10-26 VITALS — BP 117/83 | HR 78 | Resp 17 | Ht 64.0 in | Wt 229.0 lb

## 2017-10-26 DIAGNOSIS — R768 Other specified abnormal immunological findings in serum: Secondary | ICD-10-CM | POA: Diagnosis not present

## 2017-10-26 DIAGNOSIS — Z720 Tobacco use: Secondary | ICD-10-CM | POA: Diagnosis not present

## 2017-10-26 DIAGNOSIS — Z8261 Family history of arthritis: Secondary | ICD-10-CM | POA: Diagnosis not present

## 2017-10-26 DIAGNOSIS — M79642 Pain in left hand: Secondary | ICD-10-CM | POA: Diagnosis not present

## 2017-10-26 DIAGNOSIS — R7689 Other specified abnormal immunological findings in serum: Secondary | ICD-10-CM

## 2017-10-26 DIAGNOSIS — M25562 Pain in left knee: Secondary | ICD-10-CM

## 2017-10-26 DIAGNOSIS — M79641 Pain in right hand: Secondary | ICD-10-CM | POA: Diagnosis not present

## 2017-10-26 DIAGNOSIS — L509 Urticaria, unspecified: Secondary | ICD-10-CM | POA: Diagnosis not present

## 2017-10-26 DIAGNOSIS — Z8659 Personal history of other mental and behavioral disorders: Secondary | ICD-10-CM | POA: Diagnosis not present

## 2017-10-26 DIAGNOSIS — G8929 Other chronic pain: Secondary | ICD-10-CM

## 2017-10-26 DIAGNOSIS — M25561 Pain in right knee: Secondary | ICD-10-CM

## 2017-10-26 DIAGNOSIS — R5383 Other fatigue: Secondary | ICD-10-CM | POA: Diagnosis not present

## 2017-10-26 DIAGNOSIS — E559 Vitamin D deficiency, unspecified: Secondary | ICD-10-CM | POA: Diagnosis not present

## 2017-10-30 LAB — PROTEIN ELECTROPHORESIS, SERUM, WITH REFLEX
Albumin ELP: 4.1 g/dL (ref 3.8–4.8)
Alpha 1: 0.4 g/dL — ABNORMAL HIGH (ref 0.2–0.3)
Alpha 2: 1.1 g/dL — ABNORMAL HIGH (ref 0.5–0.9)
Beta 2: 0.4 g/dL (ref 0.2–0.5)
Beta Globulin: 0.5 g/dL (ref 0.4–0.6)
GAMMA GLOBULIN: 1.1 g/dL (ref 0.8–1.7)
Total Protein: 7.5 g/dL (ref 6.1–8.1)

## 2017-10-30 LAB — URINALYSIS, ROUTINE W REFLEX MICROSCOPIC
Bilirubin Urine: NEGATIVE
Glucose, UA: NEGATIVE
Hgb urine dipstick: NEGATIVE
Ketones, ur: NEGATIVE
LEUKOCYTES UA: NEGATIVE
NITRITE: NEGATIVE
Protein, ur: NEGATIVE
SPECIFIC GRAVITY, URINE: 1.029 (ref 1.001–1.03)

## 2017-10-30 LAB — CARDIOLIPIN ANTIBODIES, IGG, IGM, IGA
Anticardiolipin IgA: <11 [APL'U]
Anticardiolipin IgG: <14 [GPL'U]
Anticardiolipin IgM: <12 [MPL'U]

## 2017-10-30 LAB — RHEUMATOID FACTOR

## 2017-10-30 LAB — QUANTIFERON-TB GOLD PLUS
NIL: 0.01 [IU]/mL
QUANTIFERON-TB GOLD PLUS: NEGATIVE
TB1-NIL: 0.02 IU/mL
TB2-NIL: 0.01 IU/mL

## 2017-10-30 LAB — LUPUS ANTICOAGULANT EVAL W/ REFLEX
DRVVT SCREEN: 48 s — AB (ref <=45)
PTT LA SCREEN: 36 s (ref <=40)

## 2017-10-30 LAB — GLUCOSE 6 PHOSPHATE DEHYDROGENASE: G-6PDH: 21.4 U/g Hgb — ABNORMAL HIGH (ref 7.0–20.5)

## 2017-10-30 LAB — IGG, IGA, IGM
IGG (IMMUNOGLOBIN G), SERUM: 1159 mg/dL (ref 694–1618)
IGM, SERUM: 59 mg/dL (ref 48–271)
Immunoglobulin A: 202 mg/dL (ref 81–463)

## 2017-10-30 LAB — BETA-2 GLYCOPROTEIN ANTIBODIES: Beta-2 Glyco 1 IgM: <9 SMU (ref <=20)

## 2017-10-30 LAB — HEPATITIS B CORE ANTIBODY, IGM: Hep B C IgM: NONREACTIVE

## 2017-10-30 LAB — RNP ANTIBODY: Ribonucleic Protein(ENA) Antibody, IgG: 1.5 AI — AB

## 2017-10-30 LAB — CRYOGLOBULIN: Cryoglobulin, Qualitative Analysis: NOT DETECTED

## 2017-10-30 LAB — ANTI-SCLERODERMA ANTIBODY: Scleroderma (Scl-70) (ENA) Antibody, IgG: <1 AI

## 2017-10-30 LAB — ANTI-SMITH ANTIBODY: ENA SM Ab Ser-aCnc: <1 AI

## 2017-10-30 LAB — HEPATITIS B SURFACE ANTIGEN: Hepatitis B Surface Ag: NONREACTIVE

## 2017-10-30 LAB — HEPATITIS C ANTIBODY
Hepatitis C Ab: NONREACTIVE
SIGNAL TO CUT-OFF: 0.01 (ref <1.00)

## 2017-10-30 LAB — RFLX DRVVT CONFRIM: dRVVT Confirm: NEGATIVE

## 2017-10-30 LAB — 14-3-3 ETA PROTEIN: 14-3-3 eta Protein: <0.2 ng/mL (ref <0.2)

## 2017-10-30 LAB — CK: Total CK: 57 U/L (ref 29–143)

## 2017-10-30 LAB — SJOGRENS SYNDROME-A EXTRACTABLE NUCLEAR ANTIBODY: SSA (RO) (ENA) ANTIBODY, IGG: NEGATIVE AI

## 2017-10-30 LAB — SJOGRENS SYNDROME-B EXTRACTABLE NUCLEAR ANTIBODY: SSB (LA) (ENA) ANTIBODY, IGG: NEGATIVE AI

## 2017-11-02 NOTE — Progress Notes (Signed)
We will discuss labs at the follow-up visit.

## 2017-11-04 ENCOUNTER — Other Ambulatory Visit: Payer: Self-pay | Admitting: Physician Assistant

## 2017-11-10 ENCOUNTER — Ambulatory Visit: Payer: BLUE CROSS/BLUE SHIELD | Admitting: Physician Assistant

## 2017-11-10 ENCOUNTER — Encounter: Payer: Self-pay | Admitting: Physician Assistant

## 2017-11-10 VITALS — BP 120/66 | HR 88 | Temp 97.5°F | Resp 18 | Ht 64.0 in | Wt 231.6 lb

## 2017-11-10 DIAGNOSIS — L509 Urticaria, unspecified: Secondary | ICD-10-CM | POA: Diagnosis not present

## 2017-11-10 DIAGNOSIS — F419 Anxiety disorder, unspecified: Secondary | ICD-10-CM

## 2017-11-10 MED ORDER — ESCITALOPRAM OXALATE 20 MG PO TABS: 20.0000 mg | ORAL_TABLET | Freq: Every day | ORAL | 2 refills | Status: DC

## 2017-11-10 NOTE — Patient Instructions (Addendum)
Counseling services  Here are some numbers below you can try but I suggest calling your insurance and finding out who is in your network and THEN calling those people or looking them up on google.   I'm a big fan of Cognitive Behavioral Therapy, look this up on You tube or check with the therapist you see if they are certified.  This form of therapy helps to teach you skills to better handle with current situation that are causing anxiety or depression.   Start on lexapro 20mg  1/2 pill a day for 4 weeks, can do the wellbutrin with this medication Can stop the wellbutrin if you start to feel better  If you are not doing better after 4 weeks we will give you samples of trintellix to try for concentration as well.

## 2017-11-10 NOTE — Progress Notes (Signed)
Subjective:    Patient ID: Margaret Mckinney, female    DOB: 11-03-1990, 27 y.o.   MRN: [id number]  HPI 27 y.o. WF with history of depression/urticaria is following up. She was sent to Dr. Corliss Skains for RNP and ANA positive, pending work up.  She was started on wellbutrin in Dec for anxiety, she was crying a lot, worrying, decreased motivation and was smoking. She states she has stopped smoking but she feels constantly anxious.    BMI is Body mass index is 39.75 kg/m., she is working on diet and exercise. Wt Readings from Last 3 Encounters:  11/10/17 231 lb 9.6 oz (105.1 kg)  10/26/17 229 lb (103.9 kg)  09/29/17 230 lb 9.6 oz (104.6 kg)     Blood pressure 120/66, pulse 88, temperature (!) 97.5 F (36.4 C), resp. rate 18, height 5\' 4"  (1.626 m), weight 231 lb 9.6 oz (105.1 kg), last menstrual period 10/27/2017, SpO2 98 %.  Medications Current Outpatient Medications on File Prior to Visit  Medication Sig  . buPROPion (WELLBUTRIN XL) 300 MG 24 hr tablet Take 1 tablet (300 mg total) by mouth every morning.  . cetirizine (ZYRTEC) 10 MG tablet Take 10 mg by mouth daily.  Marland Kitchen EPINEPHrine 0.3 mg/0.3 mL IJ SOAJ injection Inject 0.3 mg into the muscle daily as needed (allergic reaction).  . Ibuprofen (ADVIL PO) Take by mouth as needed.  Marland Kitchen levocetirizine (XYZAL) 5 MG tablet TAKE 1 TABLET BY MOUTH IN THE EVENING  . norethindrone-ethinyl estradiol (JUNEL FE,GILDESS FE,LOESTRIN FE) 1-20 MG-MCG tablet Take 1 tablet by mouth daily.   No current facility-administered medications on file prior to visit.     Problem list She has Vitamin D deficiency; Tobacco use; Anxiety; Medication management; BMI 37.0-37.9, adult; and Abnormal Pap smear of cervix on their problem list.  Review of Systems  Constitutional: Positive for fatigue. Negative for chills and fever.  HENT: Negative.   Eyes: Negative for visual disturbance.  Respiratory: Negative.   Cardiovascular: Negative.   Gastrointestinal:  Negative.  Negative for diarrhea.  Genitourinary: Negative.   Musculoskeletal: Positive for arthralgias.  Skin: Positive for rash. Negative for color change, pallor and wound.  Neurological: Positive for headaches. Negative for dizziness, facial asymmetry and numbness.       Objective:   Physical Exam  Constitutional: She is oriented to person, place, and time. She appears well-developed and well-nourished.  HENT:  Head: Normocephalic and atraumatic.  Right Ear: External ear normal.  Left Ear: External ear normal.  Mouth/Throat: Oropharynx is clear and moist.  Eyes: Pupils are equal, round, and reactive to light. Conjunctivae and EOM are normal.  Neck: Normal range of motion. Neck supple. No thyromegaly present.  Cardiovascular: Normal rate, regular rhythm and normal heart sounds. Exam reveals no gallop and no friction rub.  No murmur heard. Pulmonary/Chest: Effort normal and breath sounds normal. No respiratory distress. She has no wheezes.  Abdominal: Soft. Bowel sounds are normal. She exhibits no distension and no mass. There is no tenderness. There is no rebound and no guarding.  Musculoskeletal: Normal range of motion.  Lymphadenopathy:    She has no cervical adenopathy.  Neurological: She is alert and oriented to person, place, and time. She displays normal reflexes. No cranial nerve deficit. Coordination normal.  Skin: Skin is warm and dry.  Psychiatric: She has a normal mood and affect.       Assessment & Plan:    Anxiety - look up Cognitive Behavioral Therapy -Start on lexapro 20mg   1/2 pill a day for 4 weeks, can do the wellbutrin with this medication -If not doing better after 4 weeks we will give samples of trintellix to try for concentration as well and follow up OV 1 month after that.   Future Appointments  Date Time Provider Department Center  12/04/2017  8:30 AM Pollyann Savoyeveshwar, Shaili, MD [facility] None  07/13/2018  9:00 AM Quentin Mullingollier, Ozella Comins, PA-C [id number] None

## 2017-11-23 ENCOUNTER — Other Ambulatory Visit: Payer: Self-pay | Admitting: Physician Assistant

## 2017-11-26 DIAGNOSIS — Z8261 Family history of arthritis: Secondary | ICD-10-CM | POA: Insufficient documentation

## 2017-11-26 DIAGNOSIS — L509 Urticaria, unspecified: Secondary | ICD-10-CM | POA: Insufficient documentation

## 2017-11-26 DIAGNOSIS — R768 Other specified abnormal immunological findings in serum: Secondary | ICD-10-CM | POA: Insufficient documentation

## 2017-11-26 NOTE — Progress Notes (Signed)
Office Visit Note  Patient: Margaret Mckinney             Date of Birth: March 10, 1991           MRN: [id number]             PCP: Unk Pinto, MD Referring: Unk Pinto, MD Visit Date: 12/04/2017 Occupation: '@GUAROCC' @    Subjective:  Episodic rash and hives.   History of Present Illness: ESPERANSA Mckinney is a 27 y.o. female with history of episodic rash and hives.  She states she has not had an episode in the last 4 weeks.  She usually gets 2 episodes a month.  Her urticaria gets quite severe to the point she had to the emergency room and get cortisone injections.  She also had difficulty swallowing at times.  She gets some wrist joint swelling with these episodes.  Activities of Daily Living:  Patient reports morning stiffness for 15 minutes.   Patient Denies nocturnal pain.  Difficulty dressing/grooming: Denies Difficulty climbing stairs: Denies Difficulty getting out of chair: Denies Difficulty using hands for taps, buttons, cutlery, and/or writing: Denies   Review of Systems  Constitutional: Positive for fatigue.  HENT: Negative for mouth sores, mouth dryness and nose dryness.   Eyes: Negative for pain, visual disturbance and dryness.  Respiratory: Negative for cough, hemoptysis, shortness of breath and difficulty breathing.   Cardiovascular: Negative for chest pain, palpitations, hypertension and swelling in legs/feet.  Gastrointestinal: Negative for blood in stool, constipation and diarrhea.  Endocrine: Negative for increased urination.  Genitourinary: Negative for painful urination.  Musculoskeletal: Positive for arthralgias, joint pain and morning stiffness. Negative for joint swelling, myalgias, muscle weakness, muscle tenderness and myalgias.  Skin: Negative for color change, pallor, rash, hair loss, nodules/bumps, skin tightness, ulcers and sensitivity to sunlight.  Allergic/Immunologic: Negative for susceptible to infections.  Neurological: Negative for  dizziness, numbness, headaches and weakness.  Hematological: Negative for swollen glands.  Psychiatric/Behavioral: Positive for depressed mood. Negative for sleep disturbance. The patient is nervous/anxious.     PMFS History:  Patient Active Problem List   Diagnosis Date Noted  . Positive ANA (antinuclear antibody) 11/26/2017  . Family history of rheumatoid arthritis 11/26/2017  . Urticaria 11/26/2017  . Vitamin D deficiency 07/08/2017  . Tobacco use 07/08/2017  . Anxiety 07/08/2017  . Medication management 07/08/2017  . BMI 37.0-37.9, adult 07/08/2017  . Abnormal Pap smear of cervix 07/08/2017    History reviewed. No pertinent past medical history.  Family History  Problem Relation Age of Onset  . Healthy Mother   . Healthy Father   . Alopecia Brother   . Rheum arthritis Maternal Aunt   . Rheum arthritis Maternal Grandmother    Past Surgical History:  Procedure Laterality Date  . KNEE SURGERY Left 2006   Social History   Social History Narrative  . Not on file     Objective: Vital Signs: BP 131/89 (BP Location: Left Arm, Patient Position: Sitting, Cuff Size: Normal)   Pulse 74   Resp 16   Ht '5\' 4"'  (1.626 m)   Wt 236 lb (107 kg)   BMI 40.51 kg/m    Physical Exam  Constitutional: She is oriented to person, place, and time. She appears well-developed and well-nourished.  HENT:  Head: Normocephalic and atraumatic.  Eyes: Conjunctivae and EOM are normal.  Neck: Normal range of motion.  Cardiovascular: Normal rate, regular rhythm, normal heart sounds and intact distal pulses.  Pulmonary/Chest: Effort normal and breath sounds  normal.  Abdominal: Soft. Bowel sounds are normal.  Lymphadenopathy:    She has no cervical adenopathy.  Neurological: She is alert and oriented to person, place, and time.  Skin: Skin is warm and dry. Capillary refill takes less than 2 seconds.  Psychiatric: She has a normal mood and affect. Her behavior is normal.  Nursing note and vitals  reviewed.    Musculoskeletal Exam: C-spine thoracic lumbar spine good range of motion.  Shoulder joints elbow joints wrist joint MCPs PIPs DIPs but good range of motion with no synovitis.  Hip joints knee joints ankles MTPs PIPs DIPs were in good range of motion with no synovitis.  CDAI Exam: No CDAI exam completed.    Investigation: Findings:  TSH normal, C1 esterase normal, thyroglobulin antibody negative thyroperoxidase antibodies negative, ESR 14, Lyme titer negative, Ehrlichia titer negative ANA 1: 80 speckled, dsDNA negative, RNP 1.6+, CH 50 normal, C3-C4 normal, anti-CCP negative, ACE normal January 06, 2017 HIV negative    October 26, 2017 SPEP nonspecific,10/26/17 SPEP non specific, Igs -, TB gold -, Hep B -, Hep C -, CK 57,G6PD high ENA +RNP, (Sm,Ro, La, Scl -70 all negative), aCL-, b2 GP1-, LA -,RF-, 14-3-3n negative, cryo neg,  Imaging: No results found.  Speciality Comments: No specialty comments available.    Procedures:  No procedures performed Allergies: Nsaids   Assessment / Plan:     Visit Diagnoses: Positive ANA (antinuclear antibody) - +RNP -patient has positive ANA and positive RNP.  Which is not very high titer.  She has been having episodic wrist joint swelling and urticaria.  The urticaria episodes are twice a month.  The last episode was about 4 weeks ago.  We had detailed discussion regarding her current situation.  She is quite frustrated with the frequent visits to the emergency room and wants some solution.  I do not have a true explanation.  We discussed possibly trying Plaquenil and see if her symptoms will be alleviated with that.  Indications side effects contraindications were discussed at length.  I have given her a handout on Plaquenil.  A consent was taken.  I will start her on Plaquenil 200 mg p.o. twice daily and will check labs in 1 month.  We will monitor labs every 3 months while she is on the medication.  I also did explain that Plaquenil may not  alleviate her symptoms that could be a trial.  She was in agreement.  I will also refer her to tertiary care rheumatology for second opinion.  She was advised to get pneumococcal vaccine.  Plan: Ambulatory referral to Rheumatology  Patient was counseled on the purpose, proper use, and adverse effects of hydroxychloroquine including nausea/diarrhea, skin rash, headaches, and sun sensitivity.  Discussed importance of annual eye exams while on hydroxychloroquine to monitor to ocular toxicity and discussed importance of frequent laboratory monitoring.  Provided patient with eye exam form for baseline ophthalmologic exam.  Provided patient with educational materials on hydroxychloroquine and answered all questions.  Patient consented to hydroxychloroquine.  Will upload consent in the media tab.     Pain in both hands-she has episodic pain and swelling in her hands but there was no synovitis present on examination today.  Urticaria-she also gives history of frequent urticaria requiring her to go to emergency room each time.  Vitamin D deficiency-she is on vitamin D supplement.  Anxiety  Tobacco use-smoking cessation was discussed.  BMI 37.0-37.9, adult  Family history of rheumatoid arthritis - in maternal grand mother  and maternal aunt    Orders: Orders Placed This Encounter  Procedures  . CBC with Differential/Platelet  . COMPLETE METABOLIC PANEL WITH GFR  . Ambulatory referral to Rheumatology   Meds ordered this encounter  Medications  . hydroxychloroquine (PLAQUENIL) 200 MG tablet    Sig: Take 1 tablet (200 mg total) by mouth 2 (two) times daily.    Dispense:  60 tablet    Refill:  0    Face-to-face time spent with patient was 30 minutes.> 50% of time was spent in counseling and coordination of care.  Follow-Up Instructions: Return in about 3 months (around 03/06/2018) for Autoimmune disease, urticaria.   Bo Merino, MD  Note - This record has been created using Radio producer.  Chart creation errors have been sought, but may not always  have been located. Such creation errors do not reflect on  the standard of medical care.

## 2017-12-04 ENCOUNTER — Ambulatory Visit: Payer: BLUE CROSS/BLUE SHIELD | Admitting: Rheumatology

## 2017-12-04 ENCOUNTER — Encounter: Payer: Self-pay | Admitting: Rheumatology

## 2017-12-04 VITALS — BP 131/89 | HR 74 | Resp 16 | Ht 64.0 in | Wt 236.0 lb

## 2017-12-04 DIAGNOSIS — Z8261 Family history of arthritis: Secondary | ICD-10-CM | POA: Diagnosis not present

## 2017-12-04 DIAGNOSIS — Z79899 Other long term (current) drug therapy: Secondary | ICD-10-CM | POA: Diagnosis not present

## 2017-12-04 DIAGNOSIS — F419 Anxiety disorder, unspecified: Secondary | ICD-10-CM

## 2017-12-04 DIAGNOSIS — Z720 Tobacco use: Secondary | ICD-10-CM | POA: Diagnosis not present

## 2017-12-04 DIAGNOSIS — Z6837 Body mass index (BMI) 37.0-37.9, adult: Secondary | ICD-10-CM | POA: Diagnosis not present

## 2017-12-04 DIAGNOSIS — M79641 Pain in right hand: Secondary | ICD-10-CM | POA: Diagnosis not present

## 2017-12-04 DIAGNOSIS — E559 Vitamin D deficiency, unspecified: Secondary | ICD-10-CM | POA: Diagnosis not present

## 2017-12-04 DIAGNOSIS — R768 Other specified abnormal immunological findings in serum: Secondary | ICD-10-CM

## 2017-12-04 DIAGNOSIS — R7689 Other specified abnormal immunological findings in serum: Secondary | ICD-10-CM

## 2017-12-04 DIAGNOSIS — M79642 Pain in left hand: Secondary | ICD-10-CM | POA: Diagnosis not present

## 2017-12-04 DIAGNOSIS — L509 Urticaria, unspecified: Secondary | ICD-10-CM

## 2017-12-04 MED ORDER — HYDROXYCHLOROQUINE SULFATE 200 MG PO TABS: 200.0000 mg | ORAL_TABLET | Freq: Two times a day (BID) | ORAL | 0 refills | Status: DC

## 2017-12-04 NOTE — Patient Instructions (Signed)
Standing Labs We placed an order today for your standing lab work.    Please come back and get your standing labs in 1 month after starting plaquenil and then every 3 months.   We have open lab Monday through Friday from 8:30-11:30 AM and 1:30-4:00 PM  at the office of Dr. Pollyann Savoy.   You may experience shorter wait times on Monday and Friday afternoons. The office is located at [address], [place], [place] [address] No appointment is necessary.   Labs are drawn by [facility].  You may receive a bill from [facility] for your lab work. If you have any questions regarding directions or hours of operation,  please call [phone].    Hydroxychloroquine tablets What is this medicine? HYDROXYCHLOROQUINE (hye drox ee KLOR oh kwin) is used to treat rheumatoid arthritis and systemic lupus erythematosus. It is also used to treat malaria. This medicine may be used for other purposes; ask your health care provider or pharmacist if you have questions. COMMON BRAND NAME(S): Plaquenil, Quineprox What should I tell my health care provider before I take this medicine? They need to know if you have any of these conditions: -diabetes -eye disease, vision problems -G6PD deficiency -history of blood diseases -history of irregular heartbeat -if you often drink alcohol -kidney disease -liver disease -porphyria -psoriasis -seizures -an unusual or allergic reaction to chloroquine, hydroxychloroquine, other medicines, foods, dyes, or preservatives -pregnant or trying to get pregnant -breast-feeding How should I use this medicine? Take this medicine by mouth with a glass of water. Follow the directions on the prescription label. Avoid taking antacids within 4 hours of taking this medicine. It is best to separate these medicines by at least 4 hours. Do not cut, crush or chew this medicine. You can take it with or without food. If it upsets your stomach, take it with food. Take your  medicine at regular intervals. Do not take your medicine more often than directed. Take all of your medicine as directed even if you think you are better. Do not skip doses or stop your medicine early. Talk to your pediatrician regarding the use of this medicine in children. While this drug may be prescribed for selected conditions, precautions do apply. Overdosage: If you think you have taken too much of this medicine contact a poison control center or emergency room at once. NOTE: This medicine is only for you. Do not share this medicine with others. What if I miss a dose? If you miss a dose, take it as soon as you can. If it is almost time for your next dose, take only that dose. Do not take double or extra doses. What may interact with this medicine? Do not take this medicine with any of the following medications: -cisapride -dofetilide -dronedarone -live virus vaccines -penicillamine -pimozide -thioridazine -ziprasidone This medicine may also interact with the following medications: -ampicillin -antacids -cimetidine -cyclosporine -digoxin -medicines for diabetes, like insulin, glipizide, glyburide -medicines for seizures like carbamazepine, phenobarbital, phenytoin -mefloquine -methotrexate -other medicines that prolong the QT interval (cause an abnormal heart rhythm) -praziquantel This list may not describe all possible interactions. Give your health care provider a list of all the medicines, herbs, non-prescription drugs, or dietary supplements you use. Also tell them if you smoke, drink alcohol, or use illegal drugs. Some items may interact with your medicine. What should I watch for while using this medicine? Tell your doctor or healthcare professional if your symptoms do not start to get better or if they  get worse. Avoid taking antacids within 4 hours of taking this medicine. It is best to separate these medicines by at least 4 hours. Tell your doctor or health care  professional right away if you have any change in your eyesight. Your vision and blood may be tested before and during use of this medicine. This medicine can make you more sensitive to the sun. Keep out of the sun. If you cannot avoid being in the sun, wear protective clothing and use sunscreen. Do not use sun lamps or tanning beds/booths. What side effects may I notice from receiving this medicine? Side effects that you should report to your doctor or health care professional as soon as possible: -allergic reactions like skin rash, itching or hives, swelling of the face, lips, or tongue -changes in vision -decreased hearing or ringing of the ears -redness, blistering, peeling or loosening of the skin, including inside the mouth -seizures -sensitivity to light -signs and symptoms of a dangerous change in heartbeat or heart rhythm like chest pain; dizziness; fast or irregular heartbeat; palpitations; feeling faint or lightheaded, falls; breathing problems -signs and symptoms of liver injury like dark yellow or brown urine; general ill feeling or flu-like symptoms; light-colored stools; loss of appetite; nausea; right upper belly pain; unusually weak or tired; yellowing of the eyes or skin -signs and symptoms of low blood sugar such as feeling anxious; confusion; dizziness; increased hunger; unusually weak or tired; sweating; shakiness; cold; irritable; headache; blurred vision; fast heartbeat; loss of consciousness -uncontrollable head, mouth, neck, arm, or leg movements Side effects that usually do not require medical attention (report to your doctor or health care professional if they continue or are bothersome): -anxious -diarrhea -dizziness -hair loss -headache -irritable -loss of appetite -nausea, vomiting -stomach pain This list may not describe all possible side effects. Call your doctor for medical advice about side effects. You may report side effects to FDA at  1-800-FDA-1088. Where should I keep my medicine? Keep out of the reach of children. In children, this medicine can cause overdose with small doses. Store at room temperature between 15 and 30 degrees C (59 and 86 degrees F). Protect from moisture and light. Throw away any unused medicine after the expiration date. NOTE: This sheet is a summary. It may not cover all possible information. If you have questions about this medicine, talk to your doctor, pharmacist, or health care provider.  2018 Elsevier/Gold Standard (2016-02-27 14:16:15)

## 2017-12-31 ENCOUNTER — Other Ambulatory Visit: Payer: Self-pay | Admitting: Rheumatology

## 2017-12-31 NOTE — Telephone Encounter (Signed)
ok 

## 2017-12-31 NOTE — Telephone Encounter (Addendum)
Last Visit: 12/04/17 Next visit : 03/05/18 Labs: 09/29/17 cbc/bmp wnl PLQ Eye exam no plq eye exam on file  Left message to advise patient we need her PLQ eye exam.  Okay to refill 30 day supply PLQ?

## 2018-02-02 DIAGNOSIS — Z01419 Encounter for gynecological examination (general) (routine) without abnormal findings: Secondary | ICD-10-CM | POA: Diagnosis not present

## 2018-02-02 DIAGNOSIS — R221 Localized swelling, mass and lump, neck: Secondary | ICD-10-CM | POA: Diagnosis not present

## 2018-02-02 DIAGNOSIS — R768 Other specified abnormal immunological findings in serum: Secondary | ICD-10-CM | POA: Diagnosis not present

## 2018-02-02 DIAGNOSIS — Z6839 Body mass index (BMI) 39.0-39.9, adult: Secondary | ICD-10-CM | POA: Diagnosis not present

## 2018-02-02 DIAGNOSIS — L509 Urticaria, unspecified: Secondary | ICD-10-CM | POA: Diagnosis not present

## 2018-02-02 DIAGNOSIS — R87619 Unspecified abnormal cytological findings in specimens from cervix uteri: Secondary | ICD-10-CM | POA: Diagnosis not present

## 2018-02-02 DIAGNOSIS — R87612 Low grade squamous intraepithelial lesion on cytologic smear of cervix (LGSIL): Secondary | ICD-10-CM | POA: Diagnosis not present

## 2018-02-10 ENCOUNTER — Other Ambulatory Visit: Payer: Self-pay | Admitting: Physician Assistant

## 2018-02-10 DIAGNOSIS — N871 Moderate cervical dysplasia: Secondary | ICD-10-CM | POA: Diagnosis not present

## 2018-02-10 DIAGNOSIS — R87612 Low grade squamous intraepithelial lesion on cytologic smear of cervix (LGSIL): Secondary | ICD-10-CM | POA: Diagnosis not present

## 2018-02-20 DIAGNOSIS — S62336A Displaced fracture of neck of fifth metacarpal bone, right hand, initial encounter for closed fracture: Secondary | ICD-10-CM | POA: Diagnosis not present

## 2018-02-20 DIAGNOSIS — M7989 Other specified soft tissue disorders: Secondary | ICD-10-CM | POA: Diagnosis not present

## 2018-02-20 DIAGNOSIS — M79641 Pain in right hand: Secondary | ICD-10-CM | POA: Diagnosis not present

## 2018-02-20 DIAGNOSIS — S62396A Other fracture of fifth metacarpal bone, right hand, initial encounter for closed fracture: Secondary | ICD-10-CM | POA: Diagnosis not present

## 2018-02-20 DIAGNOSIS — W010XXA Fall on same level from slipping, tripping and stumbling without subsequent striking against object, initial encounter: Secondary | ICD-10-CM | POA: Diagnosis not present

## 2018-02-21 DIAGNOSIS — M7989 Other specified soft tissue disorders: Secondary | ICD-10-CM | POA: Diagnosis not present

## 2018-02-21 DIAGNOSIS — S62336A Displaced fracture of neck of fifth metacarpal bone, right hand, initial encounter for closed fracture: Secondary | ICD-10-CM | POA: Diagnosis not present

## 2018-02-22 NOTE — Progress Notes (Deleted)
   Office Visit Note  Patient: Margaret Mckinney             Date of Birth: [birth year]           MRN: [id number]             PCP: Lucky CowboyMcKeown, William, MD Referring: Lucky CowboyMcKeown, William, MD Visit Date: 03/05/2018 Occupation: @GUAROCC @  Subjective:  No chief complaint on file.   History of Present Illness: Margaret Mckinney is a 27 y.o. female ***   Activities of Daily Living:  Patient reports morning stiffness for *** {minute/hour:19697}.   Patient {ACTIONS;DENIES/REPORTS:21021675::"Denies"} nocturnal pain.  Difficulty dressing/grooming: {ACTIONS;DENIES/REPORTS:21021675::"Denies"} Difficulty climbing stairs: {ACTIONS;DENIES/REPORTS:21021675::"Denies"} Difficulty getting out of chair: {ACTIONS;DENIES/REPORTS:21021675::"Denies"} Difficulty using hands for taps, buttons, cutlery, and/or writing: {ACTIONS;DENIES/REPORTS:21021675::"Denies"}  No Rheumatology ROS completed.   PMFS History:  Patient Active Problem List   Diagnosis Date Noted  . Positive ANA (antinuclear antibody) 11/26/2017  . Family history of rheumatoid arthritis 11/26/2017  . Urticaria 11/26/2017  . Vitamin D deficiency 07/08/2017  . Tobacco use 07/08/2017  . Anxiety 07/08/2017  . Medication management 07/08/2017  . BMI 37.0-37.9, adult 07/08/2017  . Abnormal Pap smear of cervix 07/08/2017    No past medical history on file.  Family History  Problem Relation Age of Onset  . Healthy Mother   . Healthy Father   . Alopecia Brother   . Rheum arthritis Maternal Aunt   . Rheum arthritis Maternal Grandmother    Past Surgical History:  Procedure Laterality Date  . KNEE SURGERY Left 2006   Social History   Social History Narrative  . Not on file    Objective: Vital Signs: There were no vitals taken for this visit.   Physical Exam   Musculoskeletal Exam: ***  CDAI Exam: No CDAI exam completed.   Investigation: No additional findings.  Imaging: No results found.  Recent Labs: Lab Results  Component  Value Date   WBC 7.1 09/29/2017   HGB 14.1 09/29/2017   PLT 346 09/29/2017   NA 138 09/29/2017   K 4.4 09/29/2017   CL 103 09/29/2017   CO2 26 09/29/2017   GLUCOSE 78 09/29/2017   BUN 14 09/29/2017   CREATININE 0.85 09/29/2017   BILITOT 0.4 09/29/2017   ALKPHOS 89 01/26/2008   AST 17 09/29/2017   ALT 18 09/29/2017   PROT 7.5 10/26/2017   ALBUMIN 3.8 01/26/2008   CALCIUM 9.9 09/29/2017   GFRAA 110 09/29/2017   QFTBGOLDPLUS NEGATIVE 10/26/2017    Speciality Comments: No specialty comments available.  Procedures:  No procedures performed Allergies: Nsaids   Assessment / Plan:     Visit Diagnoses: No diagnosis found.   Orders: No orders of the defined types were placed in this encounter.  No orders of the defined types were placed in this encounter.   Face-to-face time spent with patient was *** minutes. Greater than 50% of time was spent in counseling and coordination of care.  Follow-Up Instructions: No follow-ups on file.   Ellen HenriMarissa C Donnel Venuto, CMA  Note - This record has been created using Animal nutritionistDragon software.  Chart creation errors have been sought, but may not always  have been located. Such creation errors do not reflect on  the standard of medical care.

## 2018-02-23 DIAGNOSIS — S62337A Displaced fracture of neck of fifth metacarpal bone, left hand, initial encounter for closed fracture: Secondary | ICD-10-CM | POA: Diagnosis not present

## 2018-02-24 DIAGNOSIS — W010XXA Fall on same level from slipping, tripping and stumbling without subsequent striking against object, initial encounter: Secondary | ICD-10-CM | POA: Diagnosis not present

## 2018-02-24 DIAGNOSIS — S62326A Displaced fracture of shaft of fifth metacarpal bone, right hand, initial encounter for closed fracture: Secondary | ICD-10-CM | POA: Diagnosis not present

## 2018-02-24 DIAGNOSIS — Y92009 Unspecified place in unspecified non-institutional (private) residence as the place of occurrence of the external cause: Secondary | ICD-10-CM | POA: Diagnosis not present

## 2018-02-28 ENCOUNTER — Other Ambulatory Visit: Payer: Self-pay | Admitting: Physician Assistant

## 2018-03-05 ENCOUNTER — Ambulatory Visit: Payer: BLUE CROSS/BLUE SHIELD | Admitting: Physician Assistant

## 2018-03-08 DIAGNOSIS — S62336D Displaced fracture of neck of fifth metacarpal bone, right hand, subsequent encounter for fracture with routine healing: Secondary | ICD-10-CM | POA: Diagnosis not present

## 2018-03-15 DIAGNOSIS — S62337A Displaced fracture of neck of fifth metacarpal bone, left hand, initial encounter for closed fracture: Secondary | ICD-10-CM | POA: Diagnosis not present

## 2018-03-16 DIAGNOSIS — R87613 High grade squamous intraepithelial lesion on cytologic smear of cervix (HGSIL): Secondary | ICD-10-CM | POA: Diagnosis not present

## 2018-03-16 DIAGNOSIS — N870 Mild cervical dysplasia: Secondary | ICD-10-CM | POA: Diagnosis not present

## 2018-04-13 DIAGNOSIS — S62337A Displaced fracture of neck of fifth metacarpal bone, left hand, initial encounter for closed fracture: Secondary | ICD-10-CM | POA: Diagnosis not present

## 2018-07-12 DIAGNOSIS — E538 Deficiency of other specified B group vitamins: Secondary | ICD-10-CM | POA: Insufficient documentation

## 2018-07-12 NOTE — Progress Notes (Signed)
Complete Physical  Assessment and Plan:  BMI 37.0-37.9, adult - follow up 3 months for progress monitoring - increase veggies, decrease carbs - long discussion about weight loss, diet, and exercise  Medication management -     CBC with Differential/Platelet -     BASIC METABOLIC PANEL WITH GFR -     Hepatic function panel -     Magnesium  Anxiety -     buPROPion (WELLBUTRIN XL) 150 MG 24 hr tablet; Take 1 tablet (150 mg total) by mouth every morning. Will switch lexapro to trintellix - start new medication prescribed, stress management techniques discussed, increase water, good sleep hygiene discussed, increase exercise, and increase veggies.  call the office if any new AE's from medications and we will switch them.   Tobacco use HAS QUIT X 2 MONTH WITH WELLBUTRIN, CONTINUE WELLBUTRIN  Screening cholesterol level -     Lipid panel  Screening, anemia, deficiency, iron -     Iron,Total/Total Iron Binding Cap  Vitamin D deficiency -     VITAMIN D 25 Hydroxy (Vit-D Deficiency, Fractures)  Hives of unknown origin -    MAY CONTINUE FOLLOW UP  B12 def Check B12  Diarrhea, unspecified type -     Celiac Disease Comprehensive Panel with Reflexes  Cervical adenopathy -     US Soft Tissue Head/Neck; Future  Goiter -     TSH   Discussed med's effects and SE's. Screening labs and tests as requested with regular follow-up as recommended. Over 40 minutes of exam, counseling, chart review, and complex, high level critical decision making was performed this visit.   HPI  27 y.o. female  presents for a complete physical.   Her blood pressure has been controlled at home, today their BP is BP: 124/66 She does not workout. She denies chest pain, shortness of breath, dizziness.    Has history of urticaria, has seen rheumatoid and doing a trial of plaquenil with a referral to a tertiary center to [facility], weak positive ANA with RNP +, otherwise very through work up.  She has  a history of anxiety.   She feels very stressed, very frustrated. States she is trying to eat well but continue to gain weight. She is always tired.She has right cervical adenopathy. She is not on B12 or vitamin D like she should eb    Denies any malar rash, oral ulcers, alopecia, pleuritic pain or shortness of breath, photosensitivity, dry eye or dry mouth, Raynaud's phenomenon, headache, jaw claudication or visual changes. No foot drop, postprandial abdominal pain, or palpable purpura.   She has had diarrhea x 1 month at least, several times a day with food. No blood in stool, no mucus in stool. Cramping with it.   She is on wellbutrin and lexapro but feels it is not helping.   Follows with GYN, Lyn Hurst in [place]. PAP  01/22/2017 + high rish HPV, abnormal pap ASC-US.  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    BMI is Body mass index is 43.67 kg/m., she is working on diet and exercise. Wt Readings from Last 3 Encounters:  07/13/18 254 lb 6.4 oz (115.4 kg)  12/04/17 236 lb (107 kg)  11/10/17 231 lb 9.6 oz (105.1 kg)    Current Medications:  Current Outpatient Medications on File Prior to Visit  Medication Sig Dispense Refill  . buPROPion (WELLBUTRIN XL) 300 MG 24 hr tablet TAKE 1 TABLET BY MOUTH EVERY DAY IN THE MORNING 90 tablet 1  .  cetirizine (ZYRTEC) 10 MG tablet Take 10 mg by mouth daily.    Marland Kitchen. EPINEPHrine 0.3 mg/0.3 mL IJ SOAJ injection Inject 0.3 mg into the muscle daily as needed (allergic reaction).    Marland Kitchen. escitalopram (LEXAPRO) 20 MG tablet TAKE 1 TABLET BY MOUTH EVERY DAY 90 tablet 1  . hydroxychloroquine (PLAQUENIL) 200 MG tablet TAKE 1 TABLET BY MOUTH TWICE A DAY 60 tablet 0  . Ibuprofen (ADVIL PO) Take by mouth as needed.    Marland Kitchen. levocetirizine (XYZAL) 5 MG tablet TAKE 1 TABLET BY MOUTH IN THE EVENING 90 tablet 3  . norethindrone-ethinyl estradiol (JUNEL FE,GILDESS FE,LOESTRIN FE) 1-20 MG-MCG tablet Take 1 tablet by mouth daily.     No current facility-administered  medications on file prior to visit.    Allergies:  Allergies  Allergen Reactions  . Nsaids Hives and Other (See Comments)    blisters   Medical History:  She has Vitamin D deficiency; Tobacco use; Anxiety; Medication management; BMI 37.0-37.9, adult; Abnormal Pap smear of cervix; Urticaria; and B12 deficiency on their problem list.   Health Maintenance:   Immunization History  Administered Date(s) Administered  . Influenza Inj Mdck Quad With Preservative 07/08/2017    Tetanus: Sept 2017 Pneumovax: Prevnar 13:  Flu vaccine: TODAY Zostavax:  Patient's last menstrual period was 06/22/2018. Pap: 12/2016 at GYN + HPV, ASCUS MGM:  DEXA: Colonoscopy: EGD:  Patient Care Team: Lucky CowboyMcKeown, William, MD as PCP - General (Internal Medicine)  Surgical History:  She has a past surgical history that includes Knee surgery (Left, 2006).   Family History:  Herfamily history includes Alopecia in her brother; Healthy in her father and mother; Rheum arthritis in her maternal aunt and maternal grandmother.  States no significant family history.   Social History:  She reports that she has been smoking cigarettes. She has never used smokeless tobacco. She reports current alcohol use. She reports that she does not use drugs.  Lives with boyfriend, 3 step kids, 509 boy, girl kelsy 7, 4 boy- lives in [place]  Review of Systems: Review of Systems  Constitutional: Negative.   HENT: Negative.   Eyes: Negative.   Respiratory: Negative.   Cardiovascular: Positive for palpitations. Negative for chest pain, orthopnea, claudication, leg swelling and PND.  Gastrointestinal: Negative.   Genitourinary: Negative.   Musculoskeletal: Positive for joint pain. Negative for back pain, falls, myalgias and neck pain.  Skin: Positive for itching and rash.  Neurological: Negative.   Psychiatric/Behavioral: Negative for depression, hallucinations, memory loss, substance abuse and suicidal ideas. The patient is  nervous/anxious. The patient does not have insomnia.     Physical Exam: Estimated body mass index is 43.67 kg/m as calculated from the following:   Height as of this encounter: 5\' 4"  (1.626 m).   Weight as of this encounter: 254 lb 6.4 oz (115.4 kg). BP 124/66   Pulse 96   Temp (!) 97.4 F (36.3 C)   Ht 5\' 4"  (1.626 m)   Wt 254 lb 6.4 oz (115.4 kg)   LMP 06/22/2018   SpO2 98%   BMI 43.67 kg/m  General Appearance: Well nourished, in no apparent distress.  Eyes: PERRLA, EOMs, conjunctiva no swelling or erythema, normal fundi and vessels.  Sinuses: No Frontal/maxillary tenderness  ENT/Mouth: Ext aud canals clear, normal light reflex with TMs without erythema, bulging. Good dentition. No erythema, swelling, or exudate on post pharynx. Tonsils not swollen or erythematous. Hearing normal.  Neck: Supple, thyroid fullness but no nodules. No bruits  Respiratory:  Respiratory effort normal, BS equal bilaterally without rales, rhonchi, wheezing or stridor.  Cardio: RRR without murmurs, rubs or gallops. Brisk peripheral pulses without edema.  Chest: symmetric, with normal excursions and percussion.  Breasts: defer Abdomen: Soft, nontender, no guarding, rebound, hernias, masses, or organomegaly.  Lymphatics: Non tender without lymphadenopathy.  Genitourinary: defer Musculoskeletal: Full ROM all peripheral extremities,5/5 strength, and normal gait.  Skin: Warm, dry without rashes, lesions, ecchymosis. Neuro: Cranial nerves intact, reflexes equal bilaterally. Normal muscle tone, no cerebellar symptoms. Sensation intact.  Psych: Awake and oriented X 3, normal affect, Insight and Judgment appropriate.   EKG: DEFER  Quentin Mulling 9:20 AM [facility]

## 2018-07-13 ENCOUNTER — Encounter: Payer: Self-pay | Admitting: Physician Assistant

## 2018-07-13 ENCOUNTER — Ambulatory Visit: Payer: BLUE CROSS/BLUE SHIELD | Admitting: Physician Assistant

## 2018-07-13 VITALS — BP 124/66 | HR 96 | Temp 97.4°F | Ht 64.0 in | Wt 254.4 lb

## 2018-07-13 DIAGNOSIS — Z1389 Encounter for screening for other disorder: Secondary | ICD-10-CM

## 2018-07-13 DIAGNOSIS — Z13220 Encounter for screening for lipoid disorders: Secondary | ICD-10-CM

## 2018-07-13 DIAGNOSIS — Z1329 Encounter for screening for other suspected endocrine disorder: Secondary | ICD-10-CM

## 2018-07-13 DIAGNOSIS — Z79899 Other long term (current) drug therapy: Secondary | ICD-10-CM | POA: Diagnosis not present

## 2018-07-13 DIAGNOSIS — R590 Localized enlarged lymph nodes: Secondary | ICD-10-CM

## 2018-07-13 DIAGNOSIS — Z131 Encounter for screening for diabetes mellitus: Secondary | ICD-10-CM

## 2018-07-13 DIAGNOSIS — Z0000 Encounter for general adult medical examination without abnormal findings: Secondary | ICD-10-CM

## 2018-07-13 DIAGNOSIS — R197 Diarrhea, unspecified: Secondary | ICD-10-CM | POA: Diagnosis not present

## 2018-07-13 DIAGNOSIS — Z720 Tobacco use: Secondary | ICD-10-CM

## 2018-07-13 DIAGNOSIS — Z6837 Body mass index (BMI) 37.0-37.9, adult: Secondary | ICD-10-CM

## 2018-07-13 DIAGNOSIS — F419 Anxiety disorder, unspecified: Secondary | ICD-10-CM

## 2018-07-13 DIAGNOSIS — E049 Nontoxic goiter, unspecified: Secondary | ICD-10-CM

## 2018-07-13 DIAGNOSIS — E559 Vitamin D deficiency, unspecified: Secondary | ICD-10-CM | POA: Diagnosis not present

## 2018-07-13 DIAGNOSIS — Z130 Encounter for screening for diseases of the blood and blood-forming organs and certain disorders involving the immune mechanism: Secondary | ICD-10-CM

## 2018-07-13 DIAGNOSIS — L509 Urticaria, unspecified: Secondary | ICD-10-CM

## 2018-07-13 DIAGNOSIS — E538 Deficiency of other specified B group vitamins: Secondary | ICD-10-CM

## 2018-07-13 DIAGNOSIS — R87610 Atypical squamous cells of undetermined significance on cytologic smear of cervix (ASC-US): Secondary | ICD-10-CM

## 2018-07-13 MED ORDER — VORTIOXETINE HBR 20 MG PO TABS: 20.0000 mg | ORAL_TABLET | Freq: Every day | ORAL | 3 refills | Status: DC

## 2018-07-13 NOTE — Patient Instructions (Addendum)
YOU CAN CALL TO MAKE AN ULTRASOUND..  I have put in an order for an ultrasound for you to have You can set them up at your convenience by calling this number [phone] [address] the ultrasound [address]  If you have any issues call our office and we will set this up for you.   Check out  Mini habits for weight loss book  2 apps for tracking food is myfitness pal  loseit OR can take picture of your food  TRACK YOUR FOOD BRING IT IN WITH YOU IN FEB  Stop the lexapro and start the trintellix 5 mg for 1 week and then 10mg , can stay on 10 or go up to 20    B12 is low end of normal, add sublingual B12. The sublingual is better than the pills because likely you are not absorbing in your intestines well so the sublingual gets absorbed through your mouth and ensures you get the amount you need. Will help with energy, memory/concentration, decrease nerve pain, and help with weight loss. B12 is water soluble vitamin so you can not over dose on it, and anything you do not use will be sent out in your urine.   VITAMIN D IS IMPORTANT  Vitamin D goal is between 60-80  Please make sure that you are taking your Vitamin D as directed.   It is very important as a natural anti-inflammatory   helping hair, skin, and nails, as well as reducing stroke and heart attack risk.   It helps your bones and helps with mood.  We want you on at least 5000 IU daily  It also decreases numerous cancer risks so please take it as directed.   Low Vit D is associated with a 200-300% higher risk for CANCER   and 200-300% higher risk for HEART   ATTACK  &  STROKE.    .....................................Marland Kitchen.  It is also associated with higher death rate at younger ages,   autoimmune diseases like Rheumatoid arthritis, Lupus, Multiple Sclerosis.     Also many other serious conditions, like depression, Alzheimer's  Dementia, infertility, muscle aches, fatigue, fibromyalgia -  just to name a few.  +++++++++++++++++++  Can get liquid vitamin D from [place]  OR here in [place] at  [facility] [address], [place], [place] [id number] Or you can try earth fare      Veggies are great because you can eat a ton! They are low in calories, great to fill you up, and have a ton of vitamins, minerals, and protein.       Google mindful eating and here are some tips and tricks below.   Rate your hunger before you eat on a scale of 1-10, try to eat closer to a 6 or higher. And if you are at below that, why are you eating? Slow down and listen to your body.

## 2018-07-14 LAB — LIPID PANEL
Cholesterol: 176 mg/dL (ref <200)
HDL: 87 mg/dL (ref >50)
LDL Cholesterol (Calc): 75 mg/dL (calc)
NON-HDL CHOLESTEROL (CALC): 89 mg/dL (ref <130)
Total CHOL/HDL Ratio: 2 (calc) (ref <5.0)
Triglycerides: 55 mg/dL (ref <150)

## 2018-07-14 LAB — TSH: TSH: 1.23 m[IU]/L

## 2018-07-14 LAB — CELIAC DISEASE COMPREHENSIVE PANEL WITH REFLEXES
(tTG) Ab, IgA: 1 U/mL
Immunoglobulin A: 267 mg/dL (ref 47–310)

## 2018-07-14 LAB — CBC WITH DIFFERENTIAL/PLATELET
Absolute Monocytes: 392 cells/uL (ref 200–950)
BASOS PCT: 0.4 %
Basophils Absolute: 30 cells/uL (ref 0–200)
EOS ABS: 81 {cells}/uL (ref 15–500)
Eosinophils Relative: 1.1 %
HCT: 39.4 % (ref 35.0–45.0)
Hemoglobin: 13.2 g/dL (ref 11.7–15.5)
Lymphs Abs: 1325 cells/uL (ref 850–3900)
MCH: 28.5 pg (ref 27.0–33.0)
MCHC: 33.5 g/dL (ref 32.0–36.0)
MCV: 85.1 fL (ref 80.0–100.0)
MONOS PCT: 5.3 %
MPV: 10.7 fL (ref 7.5–12.5)
NEUTROS PCT: 75.3 %
Neutro Abs: 5572 cells/uL (ref 1500–7800)
PLATELETS: 359 10*3/uL (ref 140–400)
RBC: 4.63 10*6/uL (ref 3.80–5.10)
RDW: 12.4 % (ref 11.0–15.0)
TOTAL LYMPHOCYTE: 17.9 %
WBC: 7.4 10*3/uL (ref 3.8–10.8)

## 2018-07-14 LAB — COMPLETE METABOLIC PANEL WITH GFR
AG RATIO: 1.4 (calc) (ref 1.0–2.5)
ALT: 23 U/L (ref 6–29)
AST: 20 U/L (ref 10–30)
Albumin: 4.3 g/dL (ref 3.6–5.1)
Alkaline phosphatase (APISO): 82 U/L (ref 33–115)
BILIRUBIN TOTAL: 0.3 mg/dL (ref 0.2–1.2)
BUN: 15 mg/dL (ref 7–25)
CALCIUM: 10 mg/dL (ref 8.6–10.2)
CHLORIDE: 104 mmol/L (ref 98–110)
CO2: 27 mmol/L (ref 20–32)
Creat: 0.71 mg/dL (ref 0.50–1.10)
GFR, EST AFRICAN AMERICAN: 136 mL/min/{1.73_m2} (ref >=60)
GFR, EST NON AFRICAN AMERICAN: 118 mL/min/{1.73_m2} (ref >=60)
GLOBULIN: 3.1 g/dL (ref 1.9–3.7)
Glucose, Bld: 90 mg/dL (ref 65–99)
POTASSIUM: 4.8 mmol/L (ref 3.5–5.3)
SODIUM: 138 mmol/L (ref 135–146)
TOTAL PROTEIN: 7.4 g/dL (ref 6.1–8.1)

## 2018-07-14 LAB — MICROALBUMIN / CREATININE URINE RATIO
Creatinine, Urine: 98 mg/dL (ref 20–275)
Microalb Creat Ratio: 2 mcg/mg creat (ref <30)
Microalb, Ur: 0.2 mg/dL

## 2018-07-14 LAB — URINALYSIS, ROUTINE W REFLEX MICROSCOPIC
BILIRUBIN URINE: NEGATIVE
GLUCOSE, UA: NEGATIVE
Hgb urine dipstick: NEGATIVE
Ketones, ur: NEGATIVE
Leukocytes, UA: NEGATIVE
Nitrite: NEGATIVE
PH: 6.5 (ref 5.0–8.0)
PROTEIN: NEGATIVE
SPECIFIC GRAVITY, URINE: 1.022 (ref 1.001–1.03)

## 2018-07-14 LAB — HEMOGLOBIN A1C
EAG (MMOL/L): 5.7 (calc)
Hgb A1c MFr Bld: 5.2 % of total Hgb (ref <5.7)
Mean Plasma Glucose: 103 (calc)

## 2018-07-14 LAB — VITAMIN B12: Vitamin B-12: 477 pg/mL (ref 200–1100)

## 2018-07-14 LAB — VITAMIN D 25 HYDROXY (VIT D DEFICIENCY, FRACTURES): Vit D, 25-Hydroxy: 20 ng/mL — ABNORMAL LOW (ref 30–100)

## 2018-07-14 LAB — MAGNESIUM: MAGNESIUM: 1.9 mg/dL (ref 1.5–2.5)

## 2018-08-03 ENCOUNTER — Ambulatory Visit
Admission: RE | Admit: 2018-08-03 | Discharge: 2018-08-03 | Disposition: A | Discharge status: Home / Self Care | Payer: BLUE CROSS/BLUE SHIELD | Source: Ambulatory Visit | Attending: Physician Assistant | Admitting: Physician Assistant

## 2018-08-03 DIAGNOSIS — R590 Localized enlarged lymph nodes: Secondary | ICD-10-CM

## 2018-09-17 ENCOUNTER — Encounter: Payer: Self-pay | Admitting: Physician Assistant

## 2018-09-17 ENCOUNTER — Ambulatory Visit (INDEPENDENT_AMBULATORY_CARE_PROVIDER_SITE_OTHER): Payer: BLUE CROSS/BLUE SHIELD | Admitting: Physician Assistant

## 2018-09-17 VITALS — BP 128/74 | HR 81 | Temp 97.6°F | Ht 64.0 in | Wt 250.0 lb

## 2018-09-17 DIAGNOSIS — M542 Cervicalgia: Secondary | ICD-10-CM

## 2018-09-17 DIAGNOSIS — F419 Anxiety disorder, unspecified: Secondary | ICD-10-CM | POA: Diagnosis not present

## 2018-09-17 DIAGNOSIS — Z720 Tobacco use: Secondary | ICD-10-CM | POA: Diagnosis not present

## 2018-09-17 DIAGNOSIS — E538 Deficiency of other specified B group vitamins: Secondary | ICD-10-CM | POA: Diagnosis not present

## 2018-09-17 MED ORDER — VORTIOXETINE HBR 20 MG PO TABS: 20.0000 mg | ORAL_TABLET | Freq: Every day | ORAL | 5 refills | Status: DC

## 2018-09-17 MED ORDER — BUPROPION HCL ER (XL) 300 MG PO TB24: ORAL_TABLET | ORAL | 3 refills | Status: DC

## 2018-09-17 NOTE — Patient Instructions (Signed)
What is the TMJ? The temporomandibular (tem-PUH-ro-man-DIB-yoo-ler) joint, or the TMJ, connects the upper and lower jawbones. This joint allows the jaw to open wide and move back and forth when you chew, talk, or yawn.There are also several muscles that help this joint move. There can be muscle tightness and pain in the muscle that can cause several symptoms.  What causes TMJ pain? There are many causes of TMJ pain. Repeated chewing (for example, chewing gum) and clenching your teeth can cause pain in the joint. Some TMJ pain has no obvious cause. What can I do to ease the pain? There are many things you can do to help your pain get better. When you have pain:  Eat soft foods and stay away from chewy foods (for example, taffy) Try to use both sides of your mouth to chew Don't chew gum Massage Don't open your mouth wide (for example, during yawning or singing) Don't bite your cheeks or fingernails Lower your amount of stress and worry Applying a warm, damp washcloth to the joint may help. Over-the-counter pain medicines such as ibuprofen (one brand: Advil) or acetaminophen (one brand: Tylenol) might also help. Do not use these medicines if you are allergic to them or if your doctor told you not to use them. How can I stop the pain from coming back? When your pain is better, you can do these exercises to make your muscles stronger and to keep the pain from coming back:  Resisted mouth opening: Place your thumb or two fingers under your chin and open your mouth slowly, pushing up lightly on your chin with your thumb. Hold for three to six seconds. Close your mouth slowly. Resisted mouth closing: Place your thumbs under your chin and your two index fingers on the ridge between your mouth and the bottom of your chin. Push down lightly on your chin as you close your mouth. Tongue up: Slowly open and close your mouth while keeping the tongue touching the roof of the mouth. Side-to-side jaw movement:  Place an object about one fourth of an inch thick (for example, two tongue depressors) between your front teeth. Slowly move your jaw from side to side. Increase the thickness of the object as the exercise becomes easier Forward jaw movement: Place an object about one fourth of an inch thick between your front teeth and move the bottom jaw forward so that the bottom teeth are in front of the top teeth. Increase the thickness of the object as the exercise becomes easier. These exercises should not be painful. If it hurts to do these exercises, stop doing them and talk to your family doctor.     

## 2018-09-17 NOTE — Progress Notes (Signed)
Assessment and Plan:  Anxiety -     buPROPion (WELLBUTRIN XL) 300 MG 24 hr tablet; TAKE 1 TABLET BY MOUTH EVERY DAY IN THE MORNING -     vortioxetine HBr (TRINTELLIX) 20 MG TABS tablet; Take 1 tablet (20 mg total) by mouth daily.  B12 deficiency Continue B12  Tobacco use Continue wellbutrin and continue to cut back  Neck pain Likely SCM on the right, normal US neck, worse with movement Heating pad, stop chewing gum, let us know if not better   Future Appointments  Date Time Provider Department Center  07/19/2019  9:00 AM Quentin Mulling, PA-C [facility] None    HPI 28 y.o.female presents for follow up for medication change.  She was switched from lexapro to trintellix and continue on wellbutrin for anxiety/depression last OV. She had quit smoking with wellbutrin last visit, started smoking again due to stress. She has moved back with her mom and she is trying to eat better.  Has left sided neck catching with rotation to the left, some tenderness with pushing on it. No fever, chills, no right arm pain.  She has started on B12 and vitamin D Lab Results  Component Value Date   VITAMINB12 477 07/13/2018   BMI is Body mass index is 42.91 kg/m., she is working on diet and exercise. Wt Readings from Last 3 Encounters:  09/17/18 250 lb (113.4 kg)  07/13/18 254 lb 6.4 oz (115.4 kg)  12/04/17 236 lb (107 kg)     No past medical history on file.   Allergies  Allergen Reactions  . Nsaids Hives and Other (See Comments)    blisters    Current Outpatient Medications on File Prior to Visit  Medication Sig  . buPROPion (WELLBUTRIN XL) 300 MG 24 hr tablet TAKE 1 TABLET BY MOUTH EVERY DAY IN THE MORNING  . cetirizine (ZYRTEC) 10 MG tablet Take 10 mg by mouth daily.  Marland Kitchen EPINEPHrine 0.3 mg/0.3 mL IJ SOAJ injection Inject 0.3 mg into the muscle daily as needed (allergic reaction).  . hydroxychloroquine (PLAQUENIL) 200 MG tablet TAKE 1 TABLET BY MOUTH TWICE A DAY  . Ibuprofen  (ADVIL PO) Take by mouth as needed.  Marland Kitchen levocetirizine (XYZAL) 5 MG tablet TAKE 1 TABLET BY MOUTH IN THE EVENING  . norethindrone-ethinyl estradiol (JUNEL FE,GILDESS FE,LOESTRIN FE) 1-20 MG-MCG tablet Take 1 tablet by mouth daily.  Marland Kitchen vortioxetine HBr (TRINTELLIX) 20 MG TABS tablet Take 1 tablet (20 mg total) by mouth daily.   No current facility-administered medications on file prior to visit.     ROS: all negative except above.   Physical Exam: Filed Weights   09/17/18 1005  Weight: 250 lb (113.4 kg)   Ht 5\' 4"  (1.626 m)   Wt 250 lb (113.4 kg)   BMI 42.91 kg/m  General Appearance: Well nourished, in no apparent distress. Eyes: PERRLA, EOMs, conjunctiva no swelling or erythema Sinuses: No Frontal/maxillary tenderness ENT/Mouth: Ext aud canals clear, TMs without erythema, bulging. No erythema, swelling, or exudate on post pharynx.  Tonsils not swollen or erythematous. Hearing normal.  Neck: Supple, thyroid normal.  Respiratory: Respiratory effort normal, BS equal bilaterally without rales, rhonchi, wheezing or stridor.  Cardio: RRR with no MRGs. Brisk peripheral pulses without edema.  Abdomen: Soft, + BS.  Non tender, no guarding, rebound, hernias, masses. Lymphatics: Non tender without lymphadenopathy.  Musculoskeletal: Full ROM, 5/5 strength, normal gait. normal range of motion, without spinous process tenderness, with paraspinal muscle tenderness the right side with tenderness along  SCM, normal sensation, reflexes, and pulses distal. Skin: Warm, dry without rashes, lesions, ecchymosis.  Neuro: Cranial nerves intact. Normal muscle tone, no cerebellar symptoms. Sensation intact.  Psych: Awake and oriented X 3, normal affect, Insight and Judgment appropriate.     Quentin Mulling, PA-C 10:11 AM [facility]

## 2018-11-09 ENCOUNTER — Telehealth: Payer: Self-pay

## 2018-11-09 NOTE — Telephone Encounter (Signed)
Patient's birth control is not covered by insurance. Pharmacy told her that they are not accepting the discount card anymore. Requesting an alternative. Please advise.

## 2018-11-10 NOTE — Telephone Encounter (Signed)
LMTCB

## 2018-11-11 ENCOUNTER — Other Ambulatory Visit: Payer: Self-pay

## 2018-11-11 MED ORDER — NORETHIN ACE-ETH ESTRAD-FE 1-20 MG-MCG PO TABS: 1.0000 | ORAL_TABLET | Freq: Every day | ORAL | 2 refills | Status: DC

## 2018-11-11 NOTE — Telephone Encounter (Signed)
Patient advised to call insurance and will call back with the birth control that is covered.

## 2019-01-11 ENCOUNTER — Telehealth: Payer: Self-pay

## 2019-01-11 NOTE — Telephone Encounter (Signed)
Left voice message in order to reschedule appt., will try again later.

## 2019-01-12 ENCOUNTER — Ambulatory Visit: Payer: Self-pay | Admitting: Physician Assistant

## 2019-01-19 NOTE — Progress Notes (Signed)
Assessment and Plan:  Tobacco use Smoking cessation-  instruction/counseling given, counseled patient on the dangers of tobacco use, advised patient to stop smoking, and reviewed strategies to maximize success, patient not ready to quit at this time.   Anxiety Continue wellbutrin, may need to cut back due to bruxism  Vitamin D deficiency -     VITAMIN D 25 Hydroxy (Vit-D Deficiency, Fractures)  B12 deficiency -     Vitamin B12  Gingivitis -     Vitamin B12 -     chlorhexidine (PERIDEX) 0.12 % solution; Use as directed 15 mLs in the mouth or throat 2 (two) times daily. Follow up dentist Oral hygiene emphasized.   ADD Counseled to not take daily, only work days -     lisdexamfetamine (VYVANSE) 30 MG capsule; Take 1 capsule (30 mg total) by mouth daily.         Future Appointments  Date Time Provider [place]  07/19/2019  9:00 AM Vicie Mutters, PA-C [facility] None    HPI 28 y.o.female presents for follow up for medication change.   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 She states she was on vyvanse her senior year and college and she would like to get back on it since her new job she needs to multi task and provide more detail and she is struggling to complete tasks.  She is walking more and being more active.   She is on wellbutrin for anxiety/depression, states it is doing well. She had quit smoking with wellbutrin last visit, started smoking again due to stress but is working on cutting back right now.  She has moved back with her mom and she is trying to eat better.   She has started on B12 and vitamin D Lab Results  Component Value Date   [id number] 401 07/13/2018   BMI is Body mass index is 41.88 kg/m., she is working on diet and exercise. Wt Readings from Last 3 Encounters:  01/20/19 244 lb (110.7 kg)  09/17/18 250 lb (113.4 kg)  07/13/18 254 lb 6.4 oz (115.4 kg)     No past  medical history on file.   Allergies  Allergen Reactions  . Nsaids Hives and Other (See Comments)    blisters    Current Outpatient Medications on File Prior to Visit  Medication Sig  . cetirizine (ZYRTEC) 10 MG tablet Take 10 mg by mouth daily.  Marland Kitchen EPINEPHrine 0.3 mg/0.3 mL IJ SOAJ injection Inject 0.3 mg into the muscle daily as needed (allergic reaction).  . Ibuprofen (ADVIL PO) Take by mouth as needed.  Marland Kitchen levocetirizine (XYZAL) 5 MG tablet TAKE 1 TABLET BY MOUTH IN THE EVENING   No current facility-administered medications on file prior to visit.     ROS: all negative except above.   Physical Exam: Filed Weights   01/20/19 0923  Weight: 244 lb (110.7 kg)   BP 124/78   Pulse (!) 103   Temp 97.9 F (36.6 C)   Wt 244 lb (110.7 kg)   LMP 01/19/2019   SpO2 96%   BMI 41.88 kg/m  General Appearance: Well nourished, in no apparent distress. Eyes: PERRLA, EOMs, conjunctiva no swelling or erythema Sinuses: No Frontal/maxillary tenderness ENT/Mouth: Ext aud canals clear, TMs without erythema, bulging. No erythema, swelling, or exudate on post pharynx.  Tonsils not swollen or erythematous. Hearing normal.  Neck: Supple, thyroid normal.  Respiratory: Respiratory effort  normal, BS equal bilaterally without rales, rhonchi, wheezing or stridor.  Cardio: RRR with no MRGs. Brisk peripheral pulses without edema.  Abdomen: Soft, + BS.  Non tender, no guarding, rebound, hernias, masses. Lymphatics: Non tender without lymphadenopathy.  Musculoskeletal: Full ROM, 5/5 strength, normal gait. normal range of motion Skin: Warm, dry without rashes, lesions, ecchymosis.  Neuro: Cranial nerves intact. Normal muscle tone, no cerebellar symptoms. Sensation intact.  Psych: Awake and oriented X 3, normal affect, Insight and Judgment appropriate.     Quentin MullingAmanda Easter Schinke, PA-C 9:34 AM [facility] [facility]

## 2019-01-20 ENCOUNTER — Other Ambulatory Visit: Payer: Self-pay

## 2019-01-20 ENCOUNTER — Encounter: Payer: Self-pay | Admitting: Physician Assistant

## 2019-01-20 ENCOUNTER — Ambulatory Visit: Payer: BLUE CROSS/BLUE SHIELD | Admitting: Physician Assistant

## 2019-01-20 VITALS — BP 124/78 | HR 103 | Temp 97.9°F | Wt 244.0 lb

## 2019-01-20 DIAGNOSIS — F419 Anxiety disorder, unspecified: Secondary | ICD-10-CM

## 2019-01-20 DIAGNOSIS — Z720 Tobacco use: Secondary | ICD-10-CM

## 2019-01-20 DIAGNOSIS — Z79899 Other long term (current) drug therapy: Secondary | ICD-10-CM | POA: Diagnosis not present

## 2019-01-20 DIAGNOSIS — E559 Vitamin D deficiency, unspecified: Secondary | ICD-10-CM | POA: Diagnosis not present

## 2019-01-20 DIAGNOSIS — E538 Deficiency of other specified B group vitamins: Secondary | ICD-10-CM | POA: Diagnosis not present

## 2019-01-20 DIAGNOSIS — K0510 Chronic gingivitis, plaque induced: Secondary | ICD-10-CM

## 2019-01-20 MED ORDER — CHLORHEXIDINE GLUCONATE 0.12 % MT SOLN: 15.0000 mL | Freq: Two times a day (BID) | OROMUCOSAL | 0 refills | Status: DC

## 2019-01-20 MED ORDER — LISDEXAMFETAMINE DIMESYLATE 30 MG PO CAPS: 30.0000 mg | ORAL_CAPSULE | Freq: Every day | ORAL | 0 refills | Status: DC

## 2019-01-20 MED ORDER — BUPROPION HCL ER (XL) 300 MG PO TB24: ORAL_TABLET | ORAL | 3 refills | Status: DC

## 2019-01-20 MED ORDER — NORETHIN ACE-ETH ESTRAD-FE 1-20 MG-MCG PO TABS: 1.0000 | ORAL_TABLET | Freq: Every day | ORAL | 2 refills | Status: DC

## 2019-01-20 NOTE — Patient Instructions (Addendum)
SMOKING CESSATION  [employer]  [phone] for more information or for a free program for smoking cessation help.   You can call QUIT SMART 1-800-QUIT-NOW for free nicotine patches or replacement therapy- if they are out- keep calling  [facility] Can call for smoking cessation classes, [phone]  If you have a smart phone, please look up Smoke Free app, this will help you stay on track and give you information about money you have saved, life that you have gained back and a ton of more information.     ADVANTAGES OF QUITTING SMOKING  Within 20 minutes, blood pressure decreases. Your pulse is at normal level.  After 8 hours, carbon monoxide levels in the blood return to normal. Your oxygen level increases.  After 24 hours, the chance of having a heart attack starts to decrease. Your breath, hair, and body stop smelling like smoke.  After 48 hours, damaged nerve endings begin to recover. Your sense of taste and smell improve.  After 72 hours, the body is virtually free of nicotine. Your bronchial tubes relax and breathing becomes easier.  After 2 to 12 weeks, lungs can hold more air. Exercise becomes easier and circulation improves.  After 1 year, the risk of coronary heart disease is cut in half.  After 5 years, the risk of stroke falls to the same as a nonsmoker.  After 10 years, the risk of lung cancer is cut in half and the risk of other cancers decreases significantly.  After 15 years, the risk of coronary heart disease drops, usually to the level of a nonsmoker.  You will have extra money to spend on things other than cigarettes.    Gingivitis Gingivitis is inflammation of the gums. It can cause redness, soreness, bleeding, and swelling of the gums. This condition is usually mild and clears up with treatment. However, without treatment and proper oral hygiene, gingivitis can get worse and lead to other problems with the teeth and gums.  What are the causes? This condition is usually caused by a buildup of a sticky substance called plaque.  Plaque is made up of mucus, bacteria, and food particles.  When plaque builds up, it reacts with the saliva in the mouth to form a hard deposit called tartar, which becomes trapped around the base of the tooth.  Plaque and tartar cause irritation of the gums that leads to gingivitis. Gingivitis usually results from poor care and cleaning of the mouth and teeth (oral hygiene). What increases the risk? The following factors may make you more likely to develop this condition:  Not practicing good oral hygiene.  Eating a diet that does not provide proper nutrition.  Taking certain medicines.  Being pregnant.  Going through puberty or menopause.  Using tobacco products.  Having certain medical conditions, such as: ? Diabetes. ? Some viral or fungal infections. ? Dry mouth.  Wearing dental appliances that do not fit properly. What are the signs or symptoms? Symptoms of this condition include:  Gums that bleed easily, especially during flossing or brushing.  Swollen gums.  Gums that are bright red or purple.  Receding gums. This means that the gums are wearing away from the teeth so that more of the tooth is exposed.  Bad breath. How is this diagnosed? This condition is diagnosed with a medical history and a physical exam of the teeth and gums. You may have X-rays to see if the inflammation has spread to the supporting structures of the teeth.  How is this treated? This condition may be treated by:  Having your teeth and gums cleaned at the dentist's office to have the plaque and tartar removed.  Practicing good oral hygiene at home. This includes careful brushing and regular flossing.  Using a mouthwash. In some cases, a mouthwash may be prescribed or recommended. In more advanced cases, this condition may be treated with antibiotic medicine or surgery. Follow these  instructions at home:      Follow instructions from your dentist about how to clean your teeth. Make sure you: ? Brush your teeth two times a day using a soft-bristled toothbrush. ? Floss at least one time each day. It is best to floss before you brush your teeth. ? Use a mouthwash as told by your dentist.  Maintain a well-balanced diet. Between meals, limit your intake of foods and beverages that contain sugar.  See a dentist on a regular basis for cleaning and checkups.  Do not use any products that contain nicotine or tobacco, such as cigarettes, e-cigarettes, and chewing tobacco. If you need help quitting, ask your health care provider.  If you were prescribed an antibiotic medicine, take it as told by your dentist. Do not stop using the antibiotic even if you start to feel better.  Keep all follow-up visits as told by your dentist. This is important. Contact a health care provider if you:  Have a fever.  Have a lot of bleeding from your gums.  Have pain in your gums or teeth.  Have difficulty chewing.  Notice any loose or infected teeth.  Have swollen glands in your face or neck. Summary  Gingivitis is inflammation of the gums. It can cause redness, soreness, bleeding, and swelling of the gums. This condition is usually mild and clears up with treatment.  Follow instructions from your dentist about how to clean your teeth.  Use a mouthwash as told by your dentist.  Contact a health care provider if you have new or worsening symptoms.  Keep all follow-up visits as told by your dentist. This is important. This information is not intended to replace advice given to you by your health care provider. Make sure you discuss any questions you have with your health care provider. Document Released: 01/07/2001 Document Revised: 02/17/2018 Document Reviewed: 02/17/2018 Elsevier Interactive Patient Education  2019 [employer].

## 2019-01-21 LAB — VITAMIN D 25 HYDROXY (VIT D DEFICIENCY, FRACTURES): Vit D, 25-Hydroxy: 28 ng/mL — ABNORMAL LOW (ref 30–100)

## 2019-01-21 LAB — VITAMIN B12: Vitamin B-12: 321 pg/mL (ref 200–1100)

## 2019-02-24 MED ORDER — LISDEXAMFETAMINE DIMESYLATE 50 MG PO CAPS: 50.0000 mg | ORAL_CAPSULE | Freq: Every day | ORAL | 0 refills | Status: DC

## 2019-03-22 MED ORDER — NORETHIN ACE-ETH ESTRAD-FE 1-20 MG-MCG PO TABS: 1.0000 | ORAL_TABLET | Freq: Every day | ORAL | 1 refills | Status: DC

## 2019-04-10 ENCOUNTER — Telehealth: Payer: BLUE CROSS/BLUE SHIELD | Admitting: Family

## 2019-04-10 DIAGNOSIS — L709 Acne, unspecified: Secondary | ICD-10-CM

## 2019-04-10 MED ORDER — DOXYCYCLINE HYCLATE 100 MG PO TABS: 100.0000 mg | ORAL_TABLET | Freq: Two times a day (BID) | ORAL | 0 refills | Status: DC

## 2019-04-10 MED ORDER — CLINDAMYCIN PHOS-BENZOYL PEROX 1-5 % EX GEL: Freq: Two times a day (BID) | CUTANEOUS | 0 refills | Status: DC

## 2019-04-10 NOTE — Progress Notes (Signed)
We are sorry that you are experiencing this issue.  Here is how we plan to help!  Based on what you shared with me it looks like you have uncomplicated acne.  Acne is a disorder of the hair follicles and oil glands (sebaceous glands). The sebaceous glands secrete oils to keep the skin moist.  When the glands get clogged, it can lead to pimples or cysts.  These cysts may become infected and leave scars. Acne is very common and normally occurs at puberty.  Acne is also inherited.  Your personal care plan consists of the following recommendations:   Approximately 5 minutes was spent documenting and reviewing patient's chart.   I recommend that you use a daily cleanser  You might try an over the counter cleanser that has benzoyl peroxide.  I recommend that you start with a product that has 2.5% benzoyl peroxide.  Stronger concentrations have not been shown to be more effective.  I have prescribed a topical gel with an antibiotic:  Clindamycin-benzoyl peroxide gel.  This gel should be applied to the affected areas twice a day.  Be sure to read the package insert to understand potential side effects.  I have also prescribed one of the following additional therapies:  Doxycycline an oral antibiotic 100 mg twice a day.  These anabiotic's should help try the rash on your face.  If excessive dryness or peeling occurs, reduce dose frequency or concentration of the topical scrubs.  If excessive stinging or burning occurs, remove the topical gel with mild soap and water and resume at a lower dose the next day.  Remember oral antibiotics and topical acne treatments may increase your sensitivity to the sun!  HOME CARE:  Do not squeeze pimples because that can often lead to infections, worse acne, and scars.  Use a moisturizer that contains retinoid or fruit acids that may inhibit the development of new acne lesions.  Although there is not a clear link that foods can cause acne, doctors do believe  that too many sweets predispose you to skin problems.  GET HELP RIGHT AWAY IF:  If your acne gets worse or is not better within 10 days.  If you become depressed.  If you become pregnant, discontinue medications and call your OB/GYN.  MAKE SURE YOU:  Understand these instructions.  Will watch your condition.  Will get help right away if you are not doing well or get worse.   Your e-visit answers were reviewed by a board certified advanced clinical practitioner to complete your personal care plan.  Depending upon the condition, your plan could have included both over the counter or prescription medications.  Please review your pharmacy choice.  If there is a problem, you may contact your provider through [employer] and have the prescription routed to another pharmacy.  Your safety is important to [place].  If you have drug allergies check your prescription carefully.  For the next 24 hours you can use MyChart to ask questions about today's visit, request a non-urgent call back, or ask for a work or school excuse from your e-visit provider.  You will get an email in the next two days asking about your experience. I hope that your e-visit has been valuable and will speed your recovery.

## 2019-04-11 MED ORDER — LISDEXAMFETAMINE DIMESYLATE 50 MG PO CAPS: 50.0000 mg | ORAL_CAPSULE | Freq: Every day | ORAL | 0 refills | Status: DC

## 2019-04-11 NOTE — Telephone Encounter (Signed)
Recent Visits Date Type Provider Dept  01/20/19 Office Visit Vicie Mutters, [place]  09/17/18 Office Visit Vicie Mutters, [place]  07/13/18 Office Visit Vicie Mutters, PA-C [facility]  11/10/17 Office Visit Vicie Mutters, PA-C [place] recent visits within past 540 days with a meds authorizing provider and meeting all other requirements   Future Appointments Date Type Provider Dept  07/19/19 Appointment Vicie Mutters, PA-C [facility]  Showing future appointments within next 150 days with a meds authorizing provider and meeting all other requirements

## 2019-05-08 ENCOUNTER — Other Ambulatory Visit: Payer: Self-pay | Admitting: Family

## 2019-05-08 DIAGNOSIS — L709 Acne, unspecified: Secondary | ICD-10-CM

## 2019-05-19 MED ORDER — LISDEXAMFETAMINE DIMESYLATE 50 MG PO CAPS: 50.0000 mg | ORAL_CAPSULE | Freq: Every day | ORAL | 0 refills | Status: DC

## 2019-07-08 MED ORDER — LISDEXAMFETAMINE DIMESYLATE 50 MG PO CAPS: 50.0000 mg | ORAL_CAPSULE | Freq: Every day | ORAL | 0 refills | Status: DC

## 2019-07-17 NOTE — Progress Notes (Signed)
Complete Physical  Assessment and Plan:  Morbid obesity - follow up 3 months for progress monitoring - increase veggies, decrease carbs - long discussion about weight loss, diet, and exercise  Medication management -     CBC with Differential/Platelet -     BASIC METABOLIC PANEL WITH GFR -     Hepatic function panel -     Magnesium  Anxiety -     buPROPion (WELLBUTRIN XL) 150 MG 24 hr tablet; Take 1 tablet (150 mg total) by mouth every morning. Will switch lexapro to trintellix - start new medication prescribed, stress management techniques discussed, increase water, good sleep hygiene discussed, increase exercise, and increase veggies.  call the office if any new AE's from medications and we will switch them.   Tobacco use HAS QUIT X 2 MONTH WITH WELLBUTRIN, CONTINUE WELLBUTRIN  Screening cholesterol level -     Lipid panel  Screening, anemia, deficiency, iron -     Iron,Total/Total Iron Binding Cap  Vitamin D deficiency -     VITAMIN D 25 Hydroxy (Vit-D Deficiency, Fractures)  Hives of unknown origin -    MAY CONTINUE FOLLOW UP  B12 def Check B12  Atypical squamous cells of undetermined significance on cytologic smear of cervix (ASC-US) Will follow up with GYN  Screening for hematuria or proteinuria -     Urinalysis, Routine w reflex microscopic -     Microalbumin / Creatinine Urine Ratio   Discussed med's effects and SE's. Screening labs and tests as requested with regular follow-up as recommended. Over 40 minutes of exam, counseling, chart review, and complex, high level critical decision making was performed this visit.   HPI  28 y.o. female  presents for a complete physical.   Her blood pressure has been controlled at home, today their BP is BP: 124/86 She does not workout. She denies chest pain, shortness of breath, dizziness.   BMI is Body mass index is 37.79 kg/m., she is working on diet and exercise. Wt Readings from Last 3 Encounters:  07/19/19  230 lb 9.6 oz (104.6 kg)  01/20/19 244 lb (110.7 kg)  09/17/18 250 lb (113.4 kg)   Has history of urticaria, has seen rheumatoid, weak positive ANA with RNP +, otherwise very throuogh work up, did trial of plaquenil without issues.. She has not had any hives since moving in with her mom and leaving her BF, less stress.  She has a history of anxiety. Doing well with wellbutrin.  Dating someone now- has known x 15  Years.   She is not on B12 or vitamin D.  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 Has seen [facility] in the past.   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   Current Medications:  Current Outpatient Medications on File Prior to Visit  Medication Sig Dispense Refill  . buPROPion (WELLBUTRIN XL) 300 MG 24 hr tablet TAKE 1 TABLET BY MOUTH EVERY DAY IN THE MORNING 90 tablet 3  . cetirizine (ZYRTEC) 10 MG tablet Take 10 mg by mouth daily.    . chlorhexidine (PERIDEX) 0.12 % solution Use as directed 15 mLs in the mouth or throat 2 (two) times daily. 120 mL 0  . clindamycin-benzoyl peroxide (BENZACLIN) gel Apply topically 2 (two) times daily. 25 g 0  . EPINEPHrine 0.3 mg/0.3 mL IJ SOAJ injection Inject 0.3 mg into the muscle daily  as needed (allergic reaction).    . Ibuprofen (ADVIL PO) Take by mouth as needed.    Marland Kitchen levocetirizine (XYZAL) 5 MG tablet TAKE 1 TABLET BY MOUTH IN THE EVENING 90 tablet 3  . lisdexamfetamine (VYVANSE) 50 MG capsule Take 1 capsule (50 mg total) by mouth daily. 30 capsule 0  . norethindrone-ethinyl estradiol (LOESTRIN FE) 1-20 MG-MCG tablet Take 1 tablet by mouth daily. 3 Package 1   No current facility-administered medications on file prior to visit.   Allergies:  Allergies  Allergen Reactions  . Nsaids Hives and Other (See Comments)    blisters   Medical History:  She has Vitamin D deficiency; Tobacco use; Anxiety; Medication management; BMI  37.0-37.9, adult; Abnormal Pap smear of cervix; Urticaria; and B12 deficiency on their problem list.   Health Maintenance:   Immunization History  Administered Date(s) Administered  . Influenza Inj Mdck Quad With Preservative 07/08/2017    Tetanus: Sept 2017 Pneumovax: Prevnar 13:  Flu vaccine: needs this year Zostavax:  Patient's last menstrual period was 07/16/2019. Pap: 2019 at GYN + HPV, ASCUS- needs new GYN, has had LEEP before MGM:  DEXA: Colonoscopy: EGD:  Patient Care Team: Unk Pinto, MD as PCP - General (Internal Medicine)  Surgical History:  She has a past surgical history that includes Knee surgery (Left, 2006).   Family History:  Herfamily history includes Alopecia in her brother; Healthy in her father and mother; Rheum arthritis in her maternal aunt and maternal grandmother.   Social History:  She reports that she has been smoking cigarettes. She has never used smokeless tobacco. She reports current alcohol use. She reports that she does not use drugs. She is smoking socially now, does not need it due to wellbutrin.   Review of Systems: Review of Systems  Constitutional: Negative.   HENT: Negative.   Eyes: Negative.   Respiratory: Negative.   Cardiovascular: Negative for chest pain, palpitations, orthopnea, claudication, leg swelling and PND.  Gastrointestinal: Negative.   Genitourinary: Negative.   Musculoskeletal: Negative for back pain, falls, joint pain, myalgias and neck pain.  Skin: Negative for itching and rash.  Neurological: Negative.   Psychiatric/Behavioral: Negative for depression, hallucinations, memory loss, substance abuse and suicidal ideas. The patient is not nervous/anxious and does not have insomnia.     Physical Exam: Estimated body mass index is 37.79 kg/m as calculated from the following:   Height as of this encounter: 5' 5.5" (1.664 m).   Weight as of this encounter: 230 lb 9.6 oz (104.6 kg). BP 124/86   Pulse 78   Temp  (!) 97.3 F (36.3 C)   Ht 5' 5.5" (1.664 m)   Wt 230 lb 9.6 oz (104.6 kg)   LMP 07/16/2019   SpO2 98%   BMI 37.79 kg/m  General Appearance: Well nourished, in no apparent distress.  Eyes: PERRLA, EOMs, conjunctiva no swelling or erythema, normal fundi and vessels.  Sinuses: No Frontal/maxillary tenderness  ENT/Mouth: Ext aud canals clear, normal light reflex with TMs without erythema, bulging. Good dentition. No erythema, swelling, or exudate on post pharynx. Tonsils not swollen or erythematous. Hearing normal.  Neck: Supple, thyroid fullness but no nodules. No bruits  Respiratory: Respiratory effort normal, BS equal bilaterally without rales, rhonchi, wheezing or stridor.  Cardio: RRR without murmurs, rubs or gallops. Brisk peripheral pulses without edema.  Chest: symmetric, with normal excursions and percussion.  Breasts: defer Abdomen: Soft, nontender, no guarding, rebound, hernias, masses, or organomegaly.  Lymphatics: Non tender without lymphadenopathy.  Genitourinary: defer Musculoskeletal: Full ROM all peripheral extremities,5/5 strength, and normal gait.  Skin: Warm, dry without rashes, lesions, ecchymosis. Neuro: Cranial nerves intact, reflexes equal bilaterally. Normal muscle tone, no cerebellar symptoms. Sensation intact.  Psych: Awake and oriented X 3, normal affect, Insight and Judgment appropriate.   EKG: DEFER  Quentin MullingAmanda Azka Steger 9:21 AM [facility]

## 2019-07-19 ENCOUNTER — Other Ambulatory Visit: Payer: Self-pay

## 2019-07-19 ENCOUNTER — Encounter: Payer: Self-pay | Admitting: Physician Assistant

## 2019-07-19 ENCOUNTER — Ambulatory Visit (INDEPENDENT_AMBULATORY_CARE_PROVIDER_SITE_OTHER): Payer: BLUE CROSS/BLUE SHIELD | Admitting: Physician Assistant

## 2019-07-19 VITALS — BP 124/86 | HR 78 | Temp 97.3°F | Ht 65.5 in | Wt 230.6 lb

## 2019-07-19 DIAGNOSIS — Z1389 Encounter for screening for other disorder: Secondary | ICD-10-CM | POA: Diagnosis not present

## 2019-07-19 DIAGNOSIS — Z6837 Body mass index (BMI) 37.0-37.9, adult: Secondary | ICD-10-CM

## 2019-07-19 DIAGNOSIS — Z130 Encounter for screening for diseases of the blood and blood-forming organs and certain disorders involving the immune mechanism: Secondary | ICD-10-CM | POA: Diagnosis not present

## 2019-07-19 DIAGNOSIS — Z13220 Encounter for screening for lipoid disorders: Secondary | ICD-10-CM | POA: Diagnosis not present

## 2019-07-19 DIAGNOSIS — L509 Urticaria, unspecified: Secondary | ICD-10-CM

## 2019-07-19 DIAGNOSIS — Z79899 Other long term (current) drug therapy: Secondary | ICD-10-CM

## 2019-07-19 DIAGNOSIS — E538 Deficiency of other specified B group vitamins: Secondary | ICD-10-CM

## 2019-07-19 DIAGNOSIS — Z720 Tobacco use: Secondary | ICD-10-CM

## 2019-07-19 DIAGNOSIS — Z0001 Encounter for general adult medical examination with abnormal findings: Secondary | ICD-10-CM

## 2019-07-19 DIAGNOSIS — Z0000 Encounter for general adult medical examination without abnormal findings: Secondary | ICD-10-CM | POA: Diagnosis not present

## 2019-07-19 DIAGNOSIS — R87610 Atypical squamous cells of undetermined significance on cytologic smear of cervix (ASC-US): Secondary | ICD-10-CM

## 2019-07-19 DIAGNOSIS — E559 Vitamin D deficiency, unspecified: Secondary | ICD-10-CM

## 2019-07-19 DIAGNOSIS — F988 Other specified behavioral and emotional disorders with onset usually occurring in childhood and adolescence: Secondary | ICD-10-CM | POA: Insufficient documentation

## 2019-07-19 DIAGNOSIS — F419 Anxiety disorder, unspecified: Secondary | ICD-10-CM

## 2019-07-19 NOTE — Patient Instructions (Signed)
Follow up with GYN  Try benefiber or miralax daily for 2 weeks and see if that helps  Pay attention to when it happens, and any associations.    Irritable Bowel Syndrome, Adult  Irritable bowel syndrome (IBS) is a group of symptoms that affects the organs responsible for digestion (gastrointestinal or GI tract). IBS is not one specific disease. To regulate how the GI tract works, the body sends signals back and forth between the intestines and the brain. If you have IBS, there may be a problem with these signals. As a result, the GI tract does not function normally. The intestines may become more sensitive and overreact to certain things. This may be especially true when you eat certain foods or when you are under stress. There are four types of IBS. These may be determined based on the consistency of your stool (feces):  IBS with diarrhea.  IBS with constipation.  Mixed IBS.  Unsubtyped IBS. It is important to know which type of IBS you have. Certain treatments are more likely to be helpful for certain types of IBS. What are the causes? The exact cause of IBS is not known. What increases the risk? You may have a higher risk for IBS if you:  Are female.  Are younger than 36.  Have a family history of IBS.  Have a mental health condition, such as depression, anxiety, or post-traumatic stress disorder.  Have had a bacterial infection of your GI tract. What are the signs or symptoms? Symptoms of IBS vary from person to person. The main symptom is abdominal pain or discomfort. Other symptoms usually include one or more of the following:  Diarrhea, constipation, or both.  Abdominal swelling or bloating.  Feeling full after eating a small or regular-sized meal.  Frequent gas.  Mucus in the stool.  A feeling of having more stool left after a bowel movement. Symptoms tend to come and go. They may be triggered by stress, mental health conditions, or certain foods. How is this  diagnosed? This condition may be diagnosed based on a physical exam, your medical history, and your symptoms. You may have tests, such as:  Blood tests.  Stool test.  X-rays.  CT scan.  Colonoscopy. This is a procedure in which your GI tract is viewed with a long, thin, flexible tube. How is this treated? There is no cure for IBS, but treatment can help relieve symptoms. Treatment depends on the type of IBS you have, and may include:  Changes to your diet, such as: ? Avoiding foods that cause symptoms. ? Drinking more water. ? Following a low-FODMAP (fermentable oligosaccharides, disaccharides, monosaccharides, and polyols) diet for up to 6 weeks, or as told by your health care provider. FODMAPs are sugars that are hard for some people to digest. ? Eating more fiber. ? Eating medium-sized meals at the same times every day.  Medicines. These may include: ? Fiber supplements, if you have constipation. ? Medicine to control diarrhea (antidiarrheal medicines). ? Medicine to help control muscle tightening (spasms) in your GI tract (antispasmodic medicines). ? Medicines to help with mental health conditions, such as antidepressants or tranquilizers.  Talk therapy or counseling.  Working with a diet and nutrition specialist (dietitian) to help create a food plan that is right for you.  Managing your stress. Follow these instructions at home: Eating and drinking  Eat a healthy diet.  Eat medium-sized meals at about the same time every day. Do not eat large meals.  Gradually eat  more fiber-rich foods. These include whole grains, fruits, and vegetables. This may be especially helpful if you have IBS with constipation.  Eat a diet low in FODMAPs.  Drink enough fluid to keep your urine pale yellow.  Keep a journal of foods that seem to trigger symptoms.  Avoid foods and drinks that: ? Contain added sugar. ? Make your symptoms worse. Dairy products, caffeinated drinks, and  carbonated drinks can make symptoms worse for some people. General instructions  Take over-the-counter and prescription medicines and supplements only as told by your health care provider.  Get enough exercise. Do at least 150 minutes of moderate-intensity exercise each week.  Manage your stress. Getting enough sleep and exercise can help you manage stress.  Keep all follow-up visits as told by your health care provider and therapist. This is important. Alcohol Use  Do not drink alcohol if: ? Your health care provider tells you not to drink. ? You are pregnant, may be pregnant, or are planning to become pregnant.  If you drink alcohol, limit how much you have: ? 0-1 drink a day for women. ? 0-2 drinks a day for men.  Be aware of how much alcohol is in your drink. In the [place] one drink equals one typical bottle of beer (12 oz), one-half glass of wine (5 oz), or one shot of hard liquor (1 oz). Contact a health care provider if you have:  Constant pain.  Weight loss.  Difficulty or pain when swallowing.  Diarrhea that gets worse. Get help right away if you have:  Severe abdominal pain.  Fever.  Diarrhea with symptoms of dehydration, such as dizziness or dry mouth.  Bright red blood in your stool.  Stool that is black and tarry.  Abdominal swelling.  Vomiting that does not stop.  Blood in your vomit. Summary  Irritable bowel syndrome (IBS) is not one specific disease. It is a group of symptoms that affects digestion.  Your intestines may become more sensitive and overreact to certain things. This may be especially true when you eat certain foods or when you are under stress.  There is no cure for IBS, but treatment can help relieve symptoms. This information is not intended to replace advice given to you by your health care provider. Make sure you discuss any questions you have with your health care provider. Document Released: 07/14/2005 Document Revised:  07/07/2017 Document Reviewed: 07/07/2017 Elsevier Patient Education  2020 [employer].

## 2019-07-20 LAB — COMPLETE METABOLIC PANEL WITH GFR
AG Ratio: 1.2 (calc) (ref 1.0–2.5)
ALT: 11 U/L (ref 6–29)
AST: 13 U/L (ref 10–30)
Albumin: 3.9 g/dL (ref 3.6–5.1)
Alkaline phosphatase (APISO): 69 U/L (ref 31–125)
BUN: 16 mg/dL (ref 7–25)
CO2: 26 mmol/L (ref 20–32)
Calcium: 9.3 mg/dL (ref 8.6–10.2)
Chloride: 105 mmol/L (ref 98–110)
Creat: 0.84 mg/dL (ref 0.50–1.10)
GFR, Est African American: 110 mL/min/{1.73_m2} (ref >=60)
GFR, Est Non African American: 95 mL/min/{1.73_m2} (ref >=60)
Globulin: 3.2 g/dL (calc) (ref 1.9–3.7)
Glucose, Bld: 91 mg/dL (ref 65–99)
Potassium: 5 mmol/L (ref 3.5–5.3)
Sodium: 139 mmol/L (ref 135–146)
Total Bilirubin: 0.2 mg/dL (ref 0.2–1.2)
Total Protein: 7.1 g/dL (ref 6.1–8.1)

## 2019-07-20 LAB — URINALYSIS, ROUTINE W REFLEX MICROSCOPIC
Bilirubin Urine: NEGATIVE
Glucose, UA: NEGATIVE
Hgb urine dipstick: NEGATIVE
Ketones, ur: NEGATIVE
Leukocytes,Ua: NEGATIVE
Nitrite: NEGATIVE
Protein, ur: NEGATIVE
Specific Gravity, Urine: 1.016 (ref 1.001–1.03)
pH: 6 (ref 5.0–8.0)

## 2019-07-20 LAB — MICROALBUMIN / CREATININE URINE RATIO
Creatinine, Urine: 62 mg/dL (ref 20–275)
Microalb, Ur: <0.2 mg/dL

## 2019-07-20 LAB — CBC WITH DIFFERENTIAL/PLATELET
Absolute Monocytes: 410 cells/uL (ref 200–950)
Basophils Absolute: 38 cells/uL (ref 0–200)
Basophils Relative: 0.5 %
Eosinophils Absolute: 114 cells/uL (ref 15–500)
Eosinophils Relative: 1.5 %
HCT: 39.6 % (ref 35.0–45.0)
Hemoglobin: 13.4 g/dL (ref 11.7–15.5)
Lymphs Abs: 1596 cells/uL (ref 850–3900)
MCH: 30.5 pg (ref 27.0–33.0)
MCHC: 33.8 g/dL (ref 32.0–36.0)
MCV: 90 fL (ref 80.0–100.0)
MPV: 11 fL (ref 7.5–12.5)
Monocytes Relative: 5.4 %
Neutro Abs: 5442 cells/uL (ref 1500–7800)
Neutrophils Relative %: 71.6 %
Platelets: 341 10*3/uL (ref 140–400)
RBC: 4.4 10*6/uL (ref 3.80–5.10)
RDW: 11.7 % (ref 11.0–15.0)
Total Lymphocyte: 21 %
WBC: 7.6 10*3/uL (ref 3.8–10.8)

## 2019-07-20 LAB — VITAMIN B12: Vitamin B-12: 289 pg/mL (ref 200–1100)

## 2019-07-20 LAB — LIPID PANEL
Cholesterol: 168 mg/dL (ref <200)
HDL: 72 mg/dL (ref >=50)
LDL Cholesterol (Calc): 79 mg/dL (calc)
Non-HDL Cholesterol (Calc): 96 mg/dL (calc) (ref <130)
Total CHOL/HDL Ratio: 2.3 (calc) (ref <5.0)
Triglycerides: 86 mg/dL (ref <150)

## 2019-07-20 LAB — TSH: TSH: 1.59 mIU/L

## 2019-07-20 LAB — MAGNESIUM: Magnesium: 1.9 mg/dL (ref 1.5–2.5)

## 2019-07-20 LAB — IRON, TOTAL/TOTAL IRON BINDING CAP
%SAT: 11 % (calc) — ABNORMAL LOW (ref 16–45)
Iron: 36 ug/dL — ABNORMAL LOW (ref 40–190)
TIBC: 341 mcg/dL (calc) (ref 250–450)

## 2019-07-20 LAB — VITAMIN D 25 HYDROXY (VIT D DEFICIENCY, FRACTURES): Vit D, 25-Hydroxy: 15 ng/mL — ABNORMAL LOW (ref 30–100)

## 2019-08-30 MED ORDER — LISDEXAMFETAMINE DIMESYLATE 60 MG PO CAPS: 60.0000 mg | ORAL_CAPSULE | Freq: Every day | ORAL | 0 refills | Status: DC

## 2019-09-02 ENCOUNTER — Other Ambulatory Visit: Payer: Self-pay | Admitting: Physician Assistant

## 2019-09-28 DIAGNOSIS — H04123 Dry eye syndrome of bilateral lacrimal glands: Secondary | ICD-10-CM | POA: Diagnosis not present

## 2019-09-28 DIAGNOSIS — H5213 Myopia, bilateral: Secondary | ICD-10-CM | POA: Diagnosis not present

## 2019-10-14 MED ORDER — LISDEXAMFETAMINE DIMESYLATE 60 MG PO CAPS: 60.0000 mg | ORAL_CAPSULE | Freq: Every day | ORAL | 0 refills | Status: DC

## 2019-10-17 MED ORDER — NORETHIN ACE-ETH ESTRAD-FE 1-20 MG-MCG PO TABS: 1.0000 | ORAL_TABLET | Freq: Every day | ORAL | 1 refills | Status: DC

## 2019-11-22 MED ORDER — LISDEXAMFETAMINE DIMESYLATE 60 MG PO CAPS: 60.0000 mg | ORAL_CAPSULE | Freq: Every day | ORAL | 0 refills | Status: DC

## 2020-01-16 NOTE — Progress Notes (Signed)
Assessment and Plan:  Tobacco use Smoking cessation-  instruction/counseling given, counseled patient on the dangers of tobacco use, advised patient to stop smoking, and reviewed strategies to maximize success, patient not ready to quit at this time.   Anxiety Continue wellbutrin, may need to cut back due to bruxism  Vitamin D deficiency -     VITAMIN D 25 Hydroxy (Vit-D Deficiency, Fractures)  B12 deficiency -     Vitamin B12  ADD Counseled to not take daily, only work days -     lisdexamfetamine (VYVANSE) 60 MG capsule; Take 1 capsule (60 mg total) by mouth daily.  Anemia, unspecified type  with iron and B12 def will check for celiac panel -     Iron,Total/Total Iron Binding Cap -     Ferritin -     Vitamin B12 -     Folate RBC -     Celiac Disease Comprehensive Panel with Reflexes     Future Appointments  Date Time Provider [place]  07/19/2020  9:00 AM Margaret Mutters, PA-C [facility] None    HPI 29 y.o.female presents for follow up for medication change.   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 She is doing well on the vyvanse, takes it when she works only, helps with completing task and mult tasking at new job. She is walking more and being more active.   She is on wellbutrin for anxiety/depression, states it is doing well. She had quit smoking with wellbutrin last visit, started smoking again due to stress but is working on cutting back right now.  She has moved back with her mom and she is trying to eat better.   She has started on B12 and vitamin D Lab Results  Component Value Date   VITAMINB12 289 07/19/2019   BMI is Body mass index is 38.51 kg/m., she is working on diet and exercise. Wt Readings from Last 3 Encounters:  01/18/20 235 lb (106.6 kg)  07/19/19 230 lb 9.6 oz (104.6 kg)  01/20/19 244 lb (110.7 kg)   Lab Results  Component Value Date   IRON  36 (L) 07/19/2019   TIBC 341 07/19/2019   Lab Results  Component Value Date   CHOL 168 07/19/2019   HDL 72 07/19/2019   LDLCALC 79 07/19/2019   TRIG 86 07/19/2019   CHOLHDL 2.3 07/19/2019   Lab Results  Component Value Date   HGBA1C 5.2 07/13/2018     No past medical history on file.   Allergies  Allergen Reactions  . Nsaids Hives and Other (See Comments)    blisters    Current Outpatient Medications on File Prior to Visit  Medication Sig  . cetirizine (ZYRTEC) 10 MG tablet Take 10 mg by mouth daily.  . chlorhexidine (PERIDEX) 0.12 % solution Use as directed 15 mLs in the mouth or throat 2 (two) times daily.  . clindamycin-benzoyl peroxide (BENZACLIN) gel Apply topically 2 (two) times daily.  Marland Kitchen EPINEPHrine 0.3 mg/0.3 mL IJ SOAJ injection Inject 0.3 mg into the muscle daily as needed (allergic reaction).  . Ibuprofen (ADVIL PO) Take by mouth as needed.  Marland Kitchen levocetirizine (XYZAL) 5 MG tablet TAKE 1 TABLET BY MOUTH IN THE EVENING  . norethindrone-ethinyl estradiol (JUNEL FE 1/20) 1-20 MG-MCG tablet Take 1 tablet by mouth daily.   No current facility-administered medications on file prior to visit.    ROS: all negative  except above.   Physical Exam: Filed Weights   01/18/20 1050  Weight: 235 lb (106.6 kg)   BP 126/74   Pulse 84   Temp (!) 97.5 F (36.4 C)   Wt 235 lb (106.6 kg)   LMP 01/11/2020   SpO2 100%   BMI 38.51 kg/m  General Appearance: Well nourished, in no apparent distress. Eyes: PERRLA, EOMs, conjunctiva no swelling or erythema Sinuses: No Frontal/maxillary tenderness ENT/Mouth: Ext aud canals clear, TMs without erythema, bulging. No erythema, swelling, or exudate on post pharynx.  Tonsils not swollen or erythematous. Hearing normal.  Neck: Supple, thyroid normal.  Respiratory: Respiratory effort normal, BS equal bilaterally without rales, rhonchi, wheezing or stridor.  Cardio: RRR with no MRGs. Brisk peripheral pulses without edema.  Abdomen: Soft,  + BS.  Non tender, no guarding, rebound, hernias, masses. Lymphatics: Non tender without lymphadenopathy.  Musculoskeletal: Full ROM, 5/5 strength, normal gait. normal range of motion Skin: Warm, dry without rashes, lesions, ecchymosis.  Neuro: Cranial nerves intact. Normal muscle tone, no cerebellar symptoms. Sensation intact.  Psych: Awake and oriented X 3, normal affect, Insight and Judgment appropriate.     Margaret Mulling, PA-C 11:26 AM [facility]

## 2020-01-18 ENCOUNTER — Encounter: Payer: Self-pay | Admitting: Physician Assistant

## 2020-01-18 ENCOUNTER — Ambulatory Visit (INDEPENDENT_AMBULATORY_CARE_PROVIDER_SITE_OTHER): Payer: BLUE CROSS/BLUE SHIELD | Admitting: Physician Assistant

## 2020-01-18 ENCOUNTER — Other Ambulatory Visit: Payer: Self-pay

## 2020-01-18 VITALS — BP 126/74 | HR 84 | Temp 97.5°F | Wt 235.0 lb

## 2020-01-18 DIAGNOSIS — Z720 Tobacco use: Secondary | ICD-10-CM

## 2020-01-18 DIAGNOSIS — E538 Deficiency of other specified B group vitamins: Secondary | ICD-10-CM

## 2020-01-18 DIAGNOSIS — F988 Other specified behavioral and emotional disorders with onset usually occurring in childhood and adolescence: Secondary | ICD-10-CM

## 2020-01-18 DIAGNOSIS — Z79899 Other long term (current) drug therapy: Secondary | ICD-10-CM

## 2020-01-18 DIAGNOSIS — E559 Vitamin D deficiency, unspecified: Secondary | ICD-10-CM

## 2020-01-18 DIAGNOSIS — D649 Anemia, unspecified: Secondary | ICD-10-CM | POA: Diagnosis not present

## 2020-01-18 DIAGNOSIS — F419 Anxiety disorder, unspecified: Secondary | ICD-10-CM | POA: Diagnosis not present

## 2020-01-18 DIAGNOSIS — Z6837 Body mass index (BMI) 37.0-37.9, adult: Secondary | ICD-10-CM

## 2020-01-18 MED ORDER — BUPROPION HCL ER (XL) 300 MG PO TB24: ORAL_TABLET | ORAL | 3 refills | Status: DC

## 2020-01-18 MED ORDER — LISDEXAMFETAMINE DIMESYLATE 60 MG PO CAPS: 60.0000 mg | ORAL_CAPSULE | Freq: Every day | ORAL | 0 refills | Status: DC

## 2020-01-18 NOTE — Patient Instructions (Addendum)
Intermittent fasting is more about strategy than starvation. It's meant to reset your body in different ways, hopefully with fitness and nutrition changes as a result.  Like any big switchover, though, results may vary when it comes down to the individual level. What works for your friends may not work for you, or vice versa. That's why it's h elpful to play around with variations on intermittent fasting and healthy habits and find what works best for you.  WHAT IS INTERMITTENT FASTING AND WHY DO IT?  Intermittent fasting doesn't involve specific foods, but rather, a strict schedule regarding when you eat. Also called "time-restricted eating," the tactic has been praised for its contribution to weight loss, improved body composition, and decreased cravings. Preliminary research also suggests it may be beneficial for glucose tolerance, hormone regulation, better muscle mass and lower body fat.  Part of its appeal is the simplicity of the effort. Unlike some other trends, there's no calculations to intermittent fasting.  You simply eat within a certain block of time, usually a window of 8-10 hours. In the other big block of time -- about 14-16 hours, including when you're asleep -- you don't eat anything, not even snacks. You can drink water, coffee, tea or any other beverage that doesn't have calories.  For example, if you like having a late dinner, you might skip breakfast and have your first meal at noon and your last meal of the day at 8 p.m., and then not eat until noon again the next day.  IDEAS FOR GETTING STARTED  If you're new to the strategy, it may be helpful to eat within the typical circadian rhythm and keep eating within daylight hours. This can be especially beneficial if you're looking at intermittent fasting for weight-loss goals.  So first try only eating between 12pm to 8pm.  Outside of this time you may have water, black coffee, and hot tea. You may not eat it drink anything that  has carbs, sugars, OR artificial sugars like diet soda.   Like any major eating and fitness shift, it can take time to find the perfect fit, so don't be afraid to experiment with different options -- including ditching intermittent fasting altogether if it's simply not for you. But if it is, you may be surprised by some of the benefits that come along with the strategy.   General eating tips  What to Avoid . Avoid added sugars o Often added sugar can be found in processed foods such as many condiments, dry cereals, cakes, cookies, chips, crisps, crackers, candies, sweetened drinks, etc.  o Read labels and AVOID/DECREASE use of foods with the following in their ingredient list: Sugar, fructose, high fructose corn syrup, sucrose, glucose, maltose, dextrose, molasses, cane sugar, brown sugar, any type of syrup, agave nectar, etc.   . Avoid snacking in between meals- drink water or if you feel you need a snack, pick a high water content snack such as cucumbers, watermelon, or any veggie.  Marland Kitchen Avoid foods made with flour o If you are going to eat food made with flour, choose those made with whole-grains; and, minimize your consumption as much as is tolerable . Avoid processed foods o These foods are generally stocked in the middle of the grocery store.  o Focus on shopping on the perimeter of the grocery.  What to Include . Vegetables o GREEN LEAFY VEGETABLES: Kale, spinach, mustard greens, collard greens, cabbage, broccoli, etc. o OTHER: Asparagus, cauliflower, eggplant, carrots, peas, Brussel sprouts, tomatoes, bell  peppers, zucchini, beets, cucumbers, etc. . Grains, seeds, and legumes o Beans: kidney beans, black eyed peas, garbanzo beans, black beans, pinto beans, etc. o Whole, unrefined grains: brown rice, barley, bulgur, oatmeal, etc. . Healthy fats  o Avoid highly processed fats such as vegetable oil o Examples of healthy fats: avocado, olives, virgin olive oil, dark chocolate (?72% Cocoa),  nuts (peanuts, almonds, walnuts, cashews, pecans, etc.) o Please still do small amount of these healthy fats, they are dense in calories.  . Low - Moderate Intake of Animal Sources of Protein o Meat sources: chicken, [place], salmon, tuna. Limit to 4 ounces of meat at one time or the size of your palm. o Consider limiting dairy sources, but when choosing dairy focus on: PLAIN [place] yogurt, cottage cheese, high-protein milk . Fruit o Choose berries

## 2020-01-19 LAB — CBC WITH DIFFERENTIAL/PLATELET
Absolute Monocytes: 401 cells/uL (ref 200–950)
Basophils Absolute: 30 cells/uL (ref 0–200)
Basophils Relative: 0.5 %
Eosinophils Absolute: 183 cells/uL (ref 15–500)
Eosinophils Relative: 3.1 %
HCT: 42.8 % (ref 35.0–45.0)
Hemoglobin: 14.4 g/dL (ref 11.7–15.5)
Lymphs Abs: 1127 cells/uL (ref 850–3900)
MCH: 30.4 pg (ref 27.0–33.0)
MCHC: 33.6 g/dL (ref 32.0–36.0)
MCV: 90.3 fL (ref 80.0–100.0)
MPV: 10.2 fL (ref 7.5–12.5)
Monocytes Relative: 6.8 %
Neutro Abs: 4160 cells/uL (ref 1500–7800)
Neutrophils Relative %: 70.5 %
Platelets: 334 10*3/uL (ref 140–400)
RBC: 4.74 10*6/uL (ref 3.80–5.10)
RDW: 11.8 % (ref 11.0–15.0)
Total Lymphocyte: 19.1 %
WBC: 5.9 10*3/uL (ref 3.8–10.8)

## 2020-01-19 LAB — COMPLETE METABOLIC PANEL WITH GFR
AG Ratio: 1.4 (calc) (ref 1.0–2.5)
ALT: 48 U/L — ABNORMAL HIGH (ref 6–29)
AST: 36 U/L — ABNORMAL HIGH (ref 10–30)
Albumin: 4.5 g/dL (ref 3.6–5.1)
Alkaline phosphatase (APISO): 81 U/L (ref 31–125)
BUN: 15 mg/dL (ref 7–25)
CO2: 29 mmol/L (ref 20–32)
Calcium: 10.3 mg/dL — ABNORMAL HIGH (ref 8.6–10.2)
Chloride: 101 mmol/L (ref 98–110)
Creat: 0.84 mg/dL (ref 0.50–1.10)
GFR, Est African American: 110 mL/min/{1.73_m2} (ref >=60)
GFR, Est Non African American: 95 mL/min/{1.73_m2} (ref >=60)
Globulin: 3.3 g/dL (calc) (ref 1.9–3.7)
Glucose, Bld: 90 mg/dL (ref 65–99)
Potassium: 4.9 mmol/L (ref 3.5–5.3)
Sodium: 138 mmol/L (ref 135–146)
Total Bilirubin: 0.4 mg/dL (ref 0.2–1.2)
Total Protein: 7.8 g/dL (ref 6.1–8.1)

## 2020-01-19 LAB — CELIAC DISEASE COMPREHENSIVE PANEL WITH REFLEXES
(tTG) Ab, IgA: 1 U/mL
Immunoglobulin A: 288 mg/dL (ref 47–310)

## 2020-01-19 LAB — TSH: TSH: 1.65 mIU/L

## 2020-01-19 LAB — VITAMIN D 25 HYDROXY (VIT D DEFICIENCY, FRACTURES): Vit D, 25-Hydroxy: 20 ng/mL — ABNORMAL LOW (ref 30–100)

## 2020-01-19 LAB — IRON, TOTAL/TOTAL IRON BINDING CAP
%SAT: 20 % (calc) (ref 16–45)
Iron: 77 ug/dL (ref 40–190)
TIBC: 390 mcg/dL (calc) (ref 250–450)

## 2020-01-19 LAB — MAGNESIUM: Magnesium: 2.1 mg/dL (ref 1.5–2.5)

## 2020-01-19 LAB — FOLATE RBC: RBC Folate: 630 ng/mL RBC (ref >280)

## 2020-01-19 LAB — FERRITIN: Ferritin: 27 ng/mL (ref 16–154)

## 2020-01-19 LAB — VITAMIN B12: Vitamin B-12: 386 pg/mL (ref 200–1100)

## 2020-01-19 NOTE — Addendum Note (Signed)
Addended by: Quentin Mulling R on: 01/19/2020 01:51 PM   Modules accepted: Orders

## 2020-02-21 ENCOUNTER — Other Ambulatory Visit: Payer: BLUE CROSS/BLUE SHIELD

## 2020-02-21 ENCOUNTER — Other Ambulatory Visit: Payer: Self-pay

## 2020-02-21 DIAGNOSIS — F988 Other specified behavioral and emotional disorders with onset usually occurring in childhood and adolescence: Secondary | ICD-10-CM

## 2020-02-21 DIAGNOSIS — Z79899 Other long term (current) drug therapy: Secondary | ICD-10-CM | POA: Diagnosis not present

## 2020-02-21 NOTE — Telephone Encounter (Signed)
Refill request for Vyvanse and birth control. In que, for your review.

## 2020-02-22 LAB — COMPLETE METABOLIC PANEL WITH GFR
AG Ratio: 1.5 (calc) (ref 1.0–2.5)
ALT: 19 U/L (ref 6–29)
AST: 17 U/L (ref 10–30)
Albumin: 4.3 g/dL (ref 3.6–5.1)
Alkaline phosphatase (APISO): 69 U/L (ref 31–125)
BUN: 11 mg/dL (ref 7–25)
CO2: 25 mmol/L (ref 20–32)
Calcium: 9.5 mg/dL (ref 8.6–10.2)
Chloride: 103 mmol/L (ref 98–110)
Creat: 0.77 mg/dL (ref 0.50–1.10)
GFR, Est African American: 122 mL/min/{1.73_m2} (ref >=60)
GFR, Est Non African American: 105 mL/min/{1.73_m2} (ref >=60)
Globulin: 2.8 g/dL (calc) (ref 1.9–3.7)
Glucose, Bld: 84 mg/dL (ref 65–99)
Potassium: 4.8 mmol/L (ref 3.5–5.3)
Sodium: 139 mmol/L (ref 135–146)
Total Bilirubin: 0.4 mg/dL (ref 0.2–1.2)
Total Protein: 7.1 g/dL (ref 6.1–8.1)

## 2020-02-23 MED ORDER — LISDEXAMFETAMINE DIMESYLATE 60 MG PO CAPS: 60.0000 mg | ORAL_CAPSULE | Freq: Every day | ORAL | 0 refills | Status: DC

## 2020-02-23 MED ORDER — NORETHIN ACE-ETH ESTRAD-FE 1-20 MG-MCG PO TABS: 1.0000 | ORAL_TABLET | Freq: Every day | ORAL | 1 refills | Status: DC

## 2020-02-23 NOTE — Telephone Encounter (Signed)
Patient is calling and asking for a refill of Vyvanse  [id number] - [place], [place] - [address] [place] [address] Phone: [phone] Fax: [phone]    Recent Visits Date Type Provider Dept  01/18/20 Office Visit Quentin Mulling, PA-C [facility]  07/19/19 Office Visit Quentin Mulling, PA-C [facility]  01/20/19 Office Visit Quentin Mulling, PA-C [facility]  09/17/18 Office Visit Quentin Mulling, PA-C [facility]  Showing recent visits within past 540 days with a meds authorizing provider and meeting all other requirements Future Appointments Date Type Provider Dept  07/19/20 Appointment Quentin Mulling, PA-C [facility]  Showing future appointments within next 150 days with a meds authorizing provider and meeting all other requirements

## 2020-04-23 DIAGNOSIS — F988 Other specified behavioral and emotional disorders with onset usually occurring in childhood and adolescence: Secondary | ICD-10-CM

## 2020-04-24 MED ORDER — LISDEXAMFETAMINE DIMESYLATE 60 MG PO CAPS: 60.0000 mg | ORAL_CAPSULE | Freq: Every day | ORAL | 0 refills | Status: DC

## 2020-06-05 DIAGNOSIS — F988 Other specified behavioral and emotional disorders with onset usually occurring in childhood and adolescence: Secondary | ICD-10-CM

## 2020-06-05 MED ORDER — LISDEXAMFETAMINE DIMESYLATE 60 MG PO CAPS: 60.0000 mg | ORAL_CAPSULE | Freq: Every day | ORAL | 0 refills | Status: DC

## 2020-06-08 DIAGNOSIS — S0502XA Injury of conjunctiva and corneal abrasion without foreign body, left eye, initial encounter: Secondary | ICD-10-CM | POA: Diagnosis not present

## 2020-06-11 DIAGNOSIS — S0502XA Injury of conjunctiva and corneal abrasion without foreign body, left eye, initial encounter: Secondary | ICD-10-CM | POA: Diagnosis not present

## 2020-06-25 DIAGNOSIS — F988 Other specified behavioral and emotional disorders with onset usually occurring in childhood and adolescence: Secondary | ICD-10-CM

## 2020-06-25 DIAGNOSIS — F419 Anxiety disorder, unspecified: Secondary | ICD-10-CM

## 2020-06-25 MED ORDER — BUPROPION HCL ER (XL) 300 MG PO TB24: ORAL_TABLET | ORAL | 3 refills | Status: DC

## 2020-06-25 MED ORDER — LISDEXAMFETAMINE DIMESYLATE 60 MG PO CAPS: 60.0000 mg | ORAL_CAPSULE | Freq: Every day | ORAL | 0 refills | Status: DC

## 2020-07-19 ENCOUNTER — Encounter: Payer: BLUE CROSS/BLUE SHIELD | Admitting: Adult Health Nurse Practitioner

## 2020-08-01 DIAGNOSIS — F988 Other specified behavioral and emotional disorders with onset usually occurring in childhood and adolescence: Secondary | ICD-10-CM

## 2020-08-01 MED ORDER — LISDEXAMFETAMINE DIMESYLATE 60 MG PO CAPS: 60.0000 mg | ORAL_CAPSULE | Freq: Every day | ORAL | 0 refills | Status: DC

## 2020-08-08 ENCOUNTER — Ambulatory Visit (INDEPENDENT_AMBULATORY_CARE_PROVIDER_SITE_OTHER): Payer: BLUE CROSS/BLUE SHIELD | Admitting: Adult Health Nurse Practitioner

## 2020-08-08 ENCOUNTER — Other Ambulatory Visit: Payer: Self-pay

## 2020-08-08 ENCOUNTER — Encounter: Payer: Self-pay | Admitting: Adult Health Nurse Practitioner

## 2020-08-08 VITALS — BP 126/84 | HR 114 | Temp 97.6°F | Ht 64.0 in | Wt 246.0 lb

## 2020-08-08 DIAGNOSIS — Z0001 Encounter for general adult medical examination with abnormal findings: Secondary | ICD-10-CM

## 2020-08-08 DIAGNOSIS — D649 Anemia, unspecified: Secondary | ICD-10-CM

## 2020-08-08 DIAGNOSIS — Z720 Tobacco use: Secondary | ICD-10-CM

## 2020-08-08 DIAGNOSIS — Z6841 Body Mass Index (BMI) 40.0 and over, adult: Secondary | ICD-10-CM

## 2020-08-08 DIAGNOSIS — Z131 Encounter for screening for diabetes mellitus: Secondary | ICD-10-CM

## 2020-08-08 DIAGNOSIS — Z13220 Encounter for screening for lipoid disorders: Secondary | ICD-10-CM | POA: Diagnosis not present

## 2020-08-08 DIAGNOSIS — R87610 Atypical squamous cells of undetermined significance on cytologic smear of cervix (ASC-US): Secondary | ICD-10-CM

## 2020-08-08 DIAGNOSIS — Z136 Encounter for screening for cardiovascular disorders: Secondary | ICD-10-CM

## 2020-08-08 DIAGNOSIS — E538 Deficiency of other specified B group vitamins: Secondary | ICD-10-CM

## 2020-08-08 DIAGNOSIS — Z79899 Other long term (current) drug therapy: Secondary | ICD-10-CM

## 2020-08-08 DIAGNOSIS — Z0000 Encounter for general adult medical examination without abnormal findings: Secondary | ICD-10-CM

## 2020-08-08 DIAGNOSIS — Z130 Encounter for screening for diseases of the blood and blood-forming organs and certain disorders involving the immune mechanism: Secondary | ICD-10-CM | POA: Diagnosis not present

## 2020-08-08 DIAGNOSIS — E559 Vitamin D deficiency, unspecified: Secondary | ICD-10-CM | POA: Diagnosis not present

## 2020-08-08 DIAGNOSIS — F419 Anxiety disorder, unspecified: Secondary | ICD-10-CM

## 2020-08-08 DIAGNOSIS — Z1329 Encounter for screening for other suspected endocrine disorder: Secondary | ICD-10-CM

## 2020-08-08 DIAGNOSIS — Z1389 Encounter for screening for other disorder: Secondary | ICD-10-CM | POA: Diagnosis not present

## 2020-08-08 DIAGNOSIS — F988 Other specified behavioral and emotional disorders with onset usually occurring in childhood and adolescence: Secondary | ICD-10-CM

## 2020-08-08 NOTE — Progress Notes (Signed)
COMPLETE PHYSICAL  Assessment and Plan:   Anwitha was seen today for annual exam.  Diagnoses and all orders for this visit:  Encounter for routine adult medical exam with abnormal findings Yearly  Attention deficit disorder, unspecified hyperactivity presence Has been out of medication Rx Vyvance  Anxiety / Tobacco use   Wellbutrin 150mg  XL doing well Has tried Lexapro and trintellix in the past. Discussed stress management techniques   Discussed good sleep hygiene Discussed increasing physical activity and exercise Increase water intake   Medication management Continued  Anemia, unspecified type Not taking supplementation at this time -     Iron,Total/Total Iron Binding Cap  Screening for thyroid disorder -     TSH  Screening for diabetes mellitus -     Hemoglobin A1c  Screening, ischemic heart disease  Screening for lipid disorders -     Lipid panel  Vitamin D deficiency Continue supplementation to maintain goal of 70-100 -     VITAMIN D 25 Hydroxy (Vit-D Deficiency, Fractures)  B12 deficiency -     Vitamin B12  Screening for blood or protein in urine -     Urinalysis w microscopic + reflex cultur  Morbid obesity (HCC) BMI 40.0-44.9, adult (HCC) Discussed dietary and exercise modifications - follow up 3 months for progress monitoring - increase veggies, decrease carbs - long discussion about weight loss, diet, and exercise    Atypical squamous cells of undetermined significance on cytologic smear of cervix (ASC-US) Will follow up with GYN   Discussed med's effects and SE's. Screening labs and tests as requested with regular follow-up as recommended. Over 40 minutes of face to face interview, exam, counseling, chart review, and complex, high level critical decision making was performed this visit.   HPI  30 y.o. female  presents for a complete physical.   She reports overall she is doing well.  No health or medication concerns today.  She has  history of ADHD and using vyvance with positive results.    Her blood pressure has been controlled at home, today their BP is BP: 126/84 She does not workout. She denies chest pain, shortness of breath, dizziness.   Weight management Establish with OB/GYN related to complex history and future family planning. LMP 07/31/20.  Has Tried Concerta and Adderall XR without positive results with her concentration.  BMI is Body mass index is 42.23 kg/m., she is working on diet and exercise. Wt Readings from Last 3 Encounters:  08/08/20 246 lb (111.6 kg)  01/18/20 235 lb (106.6 kg)  07/19/19 230 lb 9.6 oz (104.6 kg)   Has history of urticaria, has seen rheumatoid, weak positive ANA with RNP +, otherwise very throuogh work up, did trial of plaquenil without issues.. She has not had any hives since moving in with her mom and leaving her BF, less stress.  She has a history of anxiety. Doing well with wellbutrin.  Dating someone now- has known x 15  Years.   She is not on B12 or vitamin D.  Lab Results  Component Value Date   VITAMINB12 386 01/18/2020   PAP  2019 + high risk HPV, abnormal pap ASC-US- wants new GYN- has had leep procedure. Has seen [facility] in the past.   [facility] in [place].   Current Medications:  Current Outpatient Medications on File Prior to Visit  Medication Sig Dispense Refill  . buPROPion (WELLBUTRIN XL) 300 MG 24 hr tablet TAKE 1 TABLET BY MOUTH EVERY DAY IN THE MORNING  90 tablet 3  . cetirizine (ZYRTEC) 10 MG tablet Take 10 mg by mouth daily.    . chlorhexidine (PERIDEX) 0.12 % solution Use as directed 15 mLs in the mouth or throat 2 (two) times daily. 120 mL 0  . clindamycin-benzoyl peroxide (BENZACLIN) gel Apply topically 2 (two) times daily. 25 g 0  . EPINEPHrine 0.3 mg/0.3 mL IJ SOAJ injection Inject 0.3 mg into the muscle daily as needed (allergic reaction).    . Ibuprofen (ADVIL PO) Take by mouth as needed.    Marland Kitchen levocetirizine (XYZAL) 5 MG tablet  TAKE 1 TABLET BY MOUTH IN THE EVENING 90 tablet 3  . norethindrone-ethinyl estradiol (JUNEL FE 1/20) 1-20 MG-MCG tablet Take 1 tablet by mouth daily. 84 tablet 1  . lisdexamfetamine (VYVANSE) 60 MG capsule Take 1 capsule (60 mg total) by mouth daily. (Patient not taking: Reported on 08/08/2020) 30 capsule 0   No current facility-administered medications on file prior to visit.   Allergies:  Allergies  Allergen Reactions  . Nsaids Hives and Other (See Comments)    blisters   Medical History:  She has Vitamin D deficiency; Tobacco use; Anxiety; Medication management; BMI 37.0-37.9, adult; Abnormal Pap smear of cervix; Urticaria; B12 deficiency; Attention deficit disorder; and Anemia on their problem list.   Health Maintenance:   Immunization History  Administered Date(s) Administered  . Influenza Inj Mdck Quad With Preservative 07/08/2017    Tetanus: Sept 2017 Pneumovax: N/A Prevnar 13: N/A Flu vaccine: needs this year Zostavax: N/A   No LMP recorded. Pap: 2019 at GYN + HPV, ASCUS- needs new GYN, has had LEEP before MGM:At age 87, WNL  DEXA: N/A Colonoscopy: N/A EGD: N/A  Patient Care Team: Lucky Cowboy, MD as PCP - General (Internal Medicine)  Surgical History:  She has a past surgical history that includes Knee surgery (Left, 2006) and Hand surgery (Right, 2019).   Family History:  Herfamily history includes Alopecia in her brother; Healthy in her father and mother; Rheum arthritis in her maternal aunt and maternal grandmother.   Social History:  She reports that she quit smoking about 2 months ago. Her smoking use included cigarettes. She has never used smokeless tobacco. She reports current alcohol use. She reports that she does not use drugs. She is smoking socially now, does not need it due to wellbutrin.   Review of Systems: Review of Systems  Constitutional: Negative.   HENT: Negative.   Eyes: Negative.   Respiratory: Negative.   Cardiovascular: Negative  for chest pain, palpitations, orthopnea, claudication, leg swelling and PND.  Gastrointestinal: Negative.   Genitourinary: Negative.   Musculoskeletal: Negative for back pain, falls, joint pain, myalgias and neck pain.  Skin: Negative for itching and rash.  Neurological: Negative.   Psychiatric/Behavioral: Negative for depression, hallucinations, memory loss, substance abuse and suicidal ideas. The patient is not nervous/anxious and does not have insomnia.     Physical Exam: Estimated body mass index is 42.23 kg/m as calculated from the following:   Height as of this encounter: 5\' 4"  (1.626 m).   Weight as of this encounter: 246 lb (111.6 kg). BP 126/84   Pulse (!) 114   Temp 97.6 F (36.4 C)   Ht 5\' 4"  (1.626 m)   Wt 246 lb (111.6 kg)   SpO2 99%   BMI 42.23 kg/m  General Appearance: Well nourished, in no apparent distress.  Eyes: PERRLA, EOMs, conjunctiva no swelling or erythema, normal fundi and vessels.  Sinuses: No Frontal/maxillary tenderness  ENT/Mouth:  Ext aud canals clear, normal light reflex with TMs without erythema, bulging. Good dentition. No erythema, swelling, or exudate on post pharynx. Tonsils not swollen or erythematous. Hearing normal.  Neck: Supple, thyroid fullness but no nodules. No bruits  Respiratory: Respiratory effort normal, BS equal bilaterally without rales, rhonchi, wheezing or stridor.  Cardio: RRR without murmurs, rubs or gallops. Brisk peripheral pulses without edema.  Chest: symmetric, with normal excursions and percussion.  Breasts: defer Abdomen: Soft, nontender, no guarding, rebound, hernias, masses, or organomegaly.  Lymphatics: Non tender without lymphadenopathy.  Genitourinary: defer Musculoskeletal: Full ROM all peripheral extremities,5/5 strength, and normal gait.  Skin: Warm, dry without rashes, lesions, ecchymosis. Neuro: Cranial nerves intact, reflexes equal bilaterally. Normal muscle tone, no cerebellar symptoms. Sensation intact.   Psych: Awake and oriented X 3, normal affect, Insight and Judgment appropriate.   EKG: DEFER today   Elder Negus, NP 3:57 PM [facility]

## 2020-08-09 ENCOUNTER — Telehealth: Payer: Self-pay

## 2020-08-09 LAB — COMPLETE METABOLIC PANEL WITH GFR
AG Ratio: 1.5 (calc) (ref 1.0–2.5)
ALT: 11 U/L (ref 6–29)
AST: 13 U/L (ref 10–30)
Albumin: 4.3 g/dL (ref 3.6–5.1)
Alkaline phosphatase (APISO): 78 U/L (ref 31–125)
BUN: 13 mg/dL (ref 7–25)
CO2: 28 mmol/L (ref 20–32)
Calcium: 9.8 mg/dL (ref 8.6–10.2)
Chloride: 104 mmol/L (ref 98–110)
Creat: 0.8 mg/dL (ref 0.50–1.10)
GFR, Est African American: 116 mL/min/{1.73_m2} (ref >=60)
GFR, Est Non African American: 100 mL/min/{1.73_m2} (ref >=60)
Globulin: 2.9 g/dL (calc) (ref 1.9–3.7)
Glucose, Bld: 79 mg/dL (ref 65–99)
Potassium: 4.7 mmol/L (ref 3.5–5.3)
Sodium: 140 mmol/L (ref 135–146)
Total Bilirubin: 0.2 mg/dL (ref 0.2–1.2)
Total Protein: 7.2 g/dL (ref 6.1–8.1)

## 2020-08-09 LAB — TSH: TSH: 1.06 mIU/L

## 2020-08-09 LAB — LIPID PANEL
Cholesterol: 155 mg/dL (ref <200)
HDL: 73 mg/dL (ref >=50)
LDL Cholesterol (Calc): 67 mg/dL (calc)
Non-HDL Cholesterol (Calc): 82 mg/dL (calc) (ref <130)
Total CHOL/HDL Ratio: 2.1 (calc) (ref <5.0)
Triglycerides: 70 mg/dL (ref <150)

## 2020-08-09 LAB — CBC WITH DIFFERENTIAL/PLATELET
Absolute Monocytes: 315 cells/uL (ref 200–950)
Basophils Absolute: 21 cells/uL (ref 0–200)
Basophils Relative: 0.6 %
Eosinophils Absolute: 109 cells/uL (ref 15–500)
Eosinophils Relative: 3.1 %
HCT: 36.5 % (ref 35.0–45.0)
Hemoglobin: 12.6 g/dL (ref 11.7–15.5)
Lymphs Abs: 494 cells/uL — ABNORMAL LOW (ref 850–3900)
MCH: 30.3 pg (ref 27.0–33.0)
MCHC: 34.5 g/dL (ref 32.0–36.0)
MCV: 87.7 fL (ref 80.0–100.0)
MPV: 10.7 fL (ref 7.5–12.5)
Monocytes Relative: 9 %
Neutro Abs: 2562 cells/uL (ref 1500–7800)
Neutrophils Relative %: 73.2 %
Platelets: 277 10*3/uL (ref 140–400)
RBC: 4.16 10*6/uL (ref 3.80–5.10)
RDW: 11.8 % (ref 11.0–15.0)
Total Lymphocyte: 14.1 %
WBC: 3.5 10*3/uL — ABNORMAL LOW (ref 3.8–10.8)

## 2020-08-09 LAB — URINALYSIS W MICROSCOPIC + REFLEX CULTURE
Bacteria, UA: NONE SEEN /HPF
Bilirubin Urine: NEGATIVE
Glucose, UA: NEGATIVE
Hgb urine dipstick: NEGATIVE
Hyaline Cast: NONE SEEN /LPF
Ketones, ur: NEGATIVE
Leukocyte Esterase: NEGATIVE
Nitrites, Initial: NEGATIVE
Protein, ur: NEGATIVE
RBC / HPF: NONE SEEN /HPF (ref 0–2)
Specific Gravity, Urine: 1.025 (ref 1.001–1.03)
Squamous Epithelial / HPF: NONE SEEN /HPF (ref <=5)
WBC, UA: NONE SEEN /HPF (ref 0–5)
pH: 5.5 (ref 5.0–8.0)

## 2020-08-09 LAB — IRON, TOTAL/TOTAL IRON BINDING CAP
%SAT: 10 % (calc) — ABNORMAL LOW (ref 16–45)
Iron: 33 ug/dL — ABNORMAL LOW (ref 40–190)
TIBC: 347 mcg/dL (calc) (ref 250–450)

## 2020-08-09 LAB — HEMOGLOBIN A1C
Hgb A1c MFr Bld: 5.2 % of total Hgb (ref <5.7)
Mean Plasma Glucose: 103 mg/dL
eAG (mmol/L): 5.7 mmol/L

## 2020-08-09 LAB — VITAMIN D 25 HYDROXY (VIT D DEFICIENCY, FRACTURES): Vit D, 25-Hydroxy: 19 ng/mL — ABNORMAL LOW (ref 30–100)

## 2020-08-09 LAB — NO CULTURE INDICATED

## 2020-08-09 LAB — VITAMIN B12: Vitamin B-12: 419 pg/mL (ref 200–1100)

## 2020-08-09 NOTE — Telephone Encounter (Signed)
Patient has requested a Rx for CONCERTA.  [facility] [place]

## 2020-08-14 ENCOUNTER — Other Ambulatory Visit: Payer: Self-pay | Admitting: Adult Health Nurse Practitioner

## 2020-08-14 DIAGNOSIS — E559 Vitamin D deficiency, unspecified: Secondary | ICD-10-CM

## 2020-08-14 DIAGNOSIS — F988 Other specified behavioral and emotional disorders with onset usually occurring in childhood and adolescence: Secondary | ICD-10-CM

## 2020-08-14 MED ORDER — LISDEXAMFETAMINE DIMESYLATE 60 MG PO CAPS: 60.0000 mg | ORAL_CAPSULE | Freq: Every day | ORAL | 0 refills | Status: DC

## 2020-08-14 MED ORDER — METHYLPHENIDATE HCL ER (OSM) 18 MG PO TBCR: 18.0000 mg | EXTENDED_RELEASE_TABLET | Freq: Every day | ORAL | 0 refills | Status: DC

## 2020-08-14 MED ORDER — CHOLECALCIFEROL 1.25 MG (50000 UT) PO CAPS: ORAL_CAPSULE | ORAL | 0 refills | Status: DC

## 2020-08-22 DIAGNOSIS — Z01419 Encounter for gynecological examination (general) (routine) without abnormal findings: Secondary | ICD-10-CM | POA: Diagnosis not present

## 2020-08-22 DIAGNOSIS — Z8742 Personal history of other diseases of the female genital tract: Secondary | ICD-10-CM | POA: Diagnosis not present

## 2020-08-22 DIAGNOSIS — Z09 Encounter for follow-up examination after completed treatment for conditions other than malignant neoplasm: Secondary | ICD-10-CM | POA: Diagnosis not present

## 2020-08-22 DIAGNOSIS — Z6839 Body mass index (BMI) 39.0-39.9, adult: Secondary | ICD-10-CM | POA: Diagnosis not present

## 2020-08-28 ENCOUNTER — Other Ambulatory Visit: Payer: Self-pay | Admitting: Adult Health Nurse Practitioner

## 2020-08-28 NOTE — Addendum Note (Signed)
Addended byElder Negus A on: 08/28/2020 01:23 PM   Modules accepted: Orders

## 2020-09-11 DIAGNOSIS — F988 Other specified behavioral and emotional disorders with onset usually occurring in childhood and adolescence: Secondary | ICD-10-CM

## 2020-09-11 MED ORDER — AMPHETAMINE-DEXTROAMPHETAMINE 10 MG PO TABS: ORAL_TABLET | ORAL | 0 refills | Status: DC

## 2020-10-02 DIAGNOSIS — Z3689 Encounter for other specified antenatal screening: Secondary | ICD-10-CM | POA: Diagnosis not present

## 2020-10-02 DIAGNOSIS — Z3200 Encounter for pregnancy test, result unknown: Secondary | ICD-10-CM | POA: Diagnosis not present

## 2020-10-25 DIAGNOSIS — Z3201 Encounter for pregnancy test, result positive: Secondary | ICD-10-CM | POA: Diagnosis not present

## 2020-11-07 DIAGNOSIS — O99211 Obesity complicating pregnancy, first trimester: Secondary | ICD-10-CM | POA: Diagnosis not present

## 2020-11-07 DIAGNOSIS — E559 Vitamin D deficiency, unspecified: Secondary | ICD-10-CM | POA: Diagnosis not present

## 2020-11-07 DIAGNOSIS — Z3A10 10 weeks gestation of pregnancy: Secondary | ICD-10-CM | POA: Diagnosis not present

## 2020-11-07 DIAGNOSIS — Z3689 Encounter for other specified antenatal screening: Secondary | ICD-10-CM | POA: Diagnosis not present

## 2020-11-07 LAB — OB RESULTS CONSOLE GC/CHLAMYDIA
Chlamydia: NEGATIVE
Gonorrhea: NEGATIVE

## 2020-11-07 LAB — OB RESULTS CONSOLE ABO/RH: RH Type: POSITIVE

## 2020-11-07 LAB — OB RESULTS CONSOLE HIV ANTIBODY (ROUTINE TESTING): HIV: NONREACTIVE

## 2020-11-07 LAB — OB RESULTS CONSOLE HEPATITIS B SURFACE ANTIGEN: Hepatitis B Surface Ag: NEGATIVE

## 2020-11-07 LAB — OB RESULTS CONSOLE RUBELLA ANTIBODY, IGM: Rubella: IMMUNE

## 2020-11-07 LAB — OB RESULTS CONSOLE RPR: RPR: NONREACTIVE

## 2020-11-11 ENCOUNTER — Other Ambulatory Visit: Payer: Self-pay | Admitting: Adult Health Nurse Practitioner

## 2020-11-11 DIAGNOSIS — E559 Vitamin D deficiency, unspecified: Secondary | ICD-10-CM

## 2020-12-17 DIAGNOSIS — Z361 Encounter for antenatal screening for raised alphafetoprotein level: Secondary | ICD-10-CM | POA: Diagnosis not present

## 2020-12-17 DIAGNOSIS — O3442 Maternal care for other abnormalities of cervix, second trimester: Secondary | ICD-10-CM | POA: Diagnosis not present

## 2021-01-03 DIAGNOSIS — Z3689 Encounter for other specified antenatal screening: Secondary | ICD-10-CM | POA: Diagnosis not present

## 2021-01-03 DIAGNOSIS — Z3A18 18 weeks gestation of pregnancy: Secondary | ICD-10-CM | POA: Diagnosis not present

## 2021-01-03 DIAGNOSIS — O3442 Maternal care for other abnormalities of cervix, second trimester: Secondary | ICD-10-CM | POA: Diagnosis not present

## 2021-01-03 DIAGNOSIS — Z363 Encounter for antenatal screening for malformations: Secondary | ICD-10-CM | POA: Diagnosis not present

## 2021-01-03 DIAGNOSIS — R5383 Other fatigue: Secondary | ICD-10-CM | POA: Diagnosis not present

## 2021-01-21 DIAGNOSIS — M5489 Other dorsalgia: Secondary | ICD-10-CM | POA: Diagnosis not present

## 2021-02-06 ENCOUNTER — Ambulatory Visit: Payer: BLUE CROSS/BLUE SHIELD | Admitting: Adult Health Nurse Practitioner

## 2021-02-06 ENCOUNTER — Ambulatory Visit: Payer: BLUE CROSS/BLUE SHIELD | Admitting: Nurse Practitioner

## 2021-02-21 ENCOUNTER — Other Ambulatory Visit: Payer: Self-pay | Admitting: Obstetrics & Gynecology

## 2021-02-21 ENCOUNTER — Other Ambulatory Visit (HOSPITAL_COMMUNITY): Payer: Self-pay | Admitting: Obstetrics & Gynecology

## 2021-02-21 DIAGNOSIS — O99891 Other specified diseases and conditions complicating pregnancy: Secondary | ICD-10-CM | POA: Diagnosis not present

## 2021-02-21 DIAGNOSIS — R1011 Right upper quadrant pain: Secondary | ICD-10-CM | POA: Diagnosis not present

## 2021-02-21 DIAGNOSIS — O368199 Decreased fetal movements, unspecified trimester, other fetus: Secondary | ICD-10-CM | POA: Diagnosis not present

## 2021-02-21 DIAGNOSIS — Z3A25 25 weeks gestation of pregnancy: Secondary | ICD-10-CM | POA: Diagnosis not present

## 2021-02-22 ENCOUNTER — Other Ambulatory Visit: Payer: Self-pay

## 2021-02-22 ENCOUNTER — Ambulatory Visit (HOSPITAL_COMMUNITY)
Admission: RE | Admit: 2021-02-22 | Discharge: 2021-02-22 | Disposition: A | Discharge status: Home / Self Care | Payer: BC Managed Care – PPO | Source: Ambulatory Visit | Attending: Obstetrics & Gynecology | Admitting: Obstetrics & Gynecology

## 2021-02-22 DIAGNOSIS — R1011 Right upper quadrant pain: Secondary | ICD-10-CM | POA: Diagnosis not present

## 2021-02-25 DIAGNOSIS — O99891 Other specified diseases and conditions complicating pregnancy: Secondary | ICD-10-CM | POA: Diagnosis not present

## 2021-03-05 DIAGNOSIS — Z3689 Encounter for other specified antenatal screening: Secondary | ICD-10-CM | POA: Diagnosis not present

## 2021-03-19 DIAGNOSIS — O26893 Other specified pregnancy related conditions, third trimester: Secondary | ICD-10-CM | POA: Diagnosis not present

## 2021-03-19 DIAGNOSIS — D72829 Elevated white blood cell count, unspecified: Secondary | ICD-10-CM | POA: Diagnosis not present

## 2021-03-19 DIAGNOSIS — O99213 Obesity complicating pregnancy, third trimester: Secondary | ICD-10-CM | POA: Diagnosis not present

## 2021-03-19 DIAGNOSIS — Z3A29 29 weeks gestation of pregnancy: Secondary | ICD-10-CM | POA: Diagnosis not present

## 2021-03-19 DIAGNOSIS — Z3689 Encounter for other specified antenatal screening: Secondary | ICD-10-CM | POA: Diagnosis not present

## 2021-03-29 DIAGNOSIS — Z23 Encounter for immunization: Secondary | ICD-10-CM | POA: Diagnosis not present

## 2021-04-12 DIAGNOSIS — N898 Other specified noninflammatory disorders of vagina: Secondary | ICD-10-CM | POA: Diagnosis not present

## 2021-05-10 DIAGNOSIS — Z3A36 36 weeks gestation of pregnancy: Secondary | ICD-10-CM | POA: Diagnosis not present

## 2021-05-10 DIAGNOSIS — Z3685 Encounter for antenatal screening for Streptococcus B: Secondary | ICD-10-CM | POA: Diagnosis not present

## 2021-05-10 DIAGNOSIS — O99213 Obesity complicating pregnancy, third trimester: Secondary | ICD-10-CM | POA: Diagnosis not present

## 2021-05-14 LAB — OB RESULTS CONSOLE GBS: GBS: NEGATIVE

## 2021-05-17 DIAGNOSIS — Z3A37 37 weeks gestation of pregnancy: Secondary | ICD-10-CM | POA: Diagnosis not present

## 2021-05-17 DIAGNOSIS — O288 Other abnormal findings on antenatal screening of mother: Secondary | ICD-10-CM | POA: Diagnosis not present

## 2021-05-21 ENCOUNTER — Encounter (HOSPITAL_COMMUNITY): Payer: Self-pay | Admitting: *Deleted

## 2021-05-21 ENCOUNTER — Encounter (HOSPITAL_COMMUNITY): Payer: Self-pay

## 2021-05-21 ENCOUNTER — Other Ambulatory Visit: Payer: Self-pay | Admitting: Obstetrics and Gynecology

## 2021-05-21 DIAGNOSIS — O99213 Obesity complicating pregnancy, third trimester: Secondary | ICD-10-CM | POA: Diagnosis not present

## 2021-05-21 DIAGNOSIS — Z3A38 38 weeks gestation of pregnancy: Secondary | ICD-10-CM | POA: Diagnosis not present

## 2021-05-21 NOTE — Patient Instructions (Signed)
Margaret Mckinney  05/21/2021   Your procedure is scheduled on:  05/27/2021  Arrive at 0530 at Entrance C on [address] at [facility]  and [employer]. You are invited to use the FREE valet parking or use the Visitor's parking deck.  Pick up the phone at the desk and dial [phone].  Call this number if you have problems the morning of surgery: [phone]  Remember:   Do not eat food:(After Midnight) Desps de medianoche.  Do not drink clear liquids: (After Midnight) Desps de medianoche.  Take these medicines the morning of surgery with A SIP OF WATER:  none   Do not wear jewelry, make-up or nail polish.  Do not wear lotions, powders, or perfumes. Do not wear deodorant.  Do not shave 48 hours prior to surgery.  Do not bring valuables to the hospital.  [facility] is not   responsible for any belongings or valuables brought to the hospital.  Contacts, dentures or bridgework may not be worn into surgery.  Leave suitcase in the car. After surgery it may be brought to your room.  For patients admitted to the hospital, checkout time is 11:00 AM the day of              discharge.      Please read over the following fact sheets that you were given:     Preparing for Surgery

## 2021-05-22 ENCOUNTER — Encounter (HOSPITAL_COMMUNITY): Payer: Self-pay | Admitting: Obstetrics and Gynecology

## 2021-05-23 MED ORDER — DEXTROSE 5 % IV SOLN: 2.0000 g | INTRAVENOUS | Status: DC

## 2021-05-23 MED ORDER — SOD CITRATE-CITRIC ACID 500-334 MG/5ML PO SOLN: 30.0000 mL | ORAL | Status: AC

## 2021-05-24 ENCOUNTER — Other Ambulatory Visit: Payer: Self-pay | Admitting: Obstetrics and Gynecology

## 2021-05-24 ENCOUNTER — Encounter (HOSPITAL_COMMUNITY)
Admission: RE | Admit: 2021-05-24 | Discharge: 2021-05-24 | Disposition: A | Discharge status: Home / Self Care | Payer: BC Managed Care – PPO | Source: Ambulatory Visit | Attending: Obstetrics and Gynecology | Admitting: Obstetrics and Gynecology

## 2021-05-24 ENCOUNTER — Other Ambulatory Visit: Payer: Self-pay

## 2021-05-24 ENCOUNTER — Other Ambulatory Visit (HOSPITAL_COMMUNITY)
Admission: RE | Admit: 2021-05-24 | Discharge: 2021-05-24 | Disposition: A | Discharge status: Home / Self Care | Payer: BC Managed Care – PPO | Source: Ambulatory Visit | Attending: Obstetrics and Gynecology | Admitting: Obstetrics and Gynecology

## 2021-05-24 DIAGNOSIS — O9902 Anemia complicating childbirth: Secondary | ICD-10-CM | POA: Diagnosis not present

## 2021-05-24 DIAGNOSIS — Z01812 Encounter for preprocedural laboratory examination: Secondary | ICD-10-CM | POA: Insufficient documentation

## 2021-05-24 DIAGNOSIS — O99344 Other mental disorders complicating childbirth: Secondary | ICD-10-CM | POA: Diagnosis not present

## 2021-05-24 DIAGNOSIS — Z3A39 39 weeks gestation of pregnancy: Secondary | ICD-10-CM | POA: Diagnosis not present

## 2021-05-24 DIAGNOSIS — Z87891 Personal history of nicotine dependence: Secondary | ICD-10-CM | POA: Diagnosis not present

## 2021-05-24 DIAGNOSIS — D649 Anemia, unspecified: Secondary | ICD-10-CM | POA: Diagnosis not present

## 2021-05-24 DIAGNOSIS — Z3A00 Weeks of gestation of pregnancy not specified: Secondary | ICD-10-CM | POA: Diagnosis not present

## 2021-05-24 DIAGNOSIS — O328XX0 Maternal care for other malpresentation of fetus, not applicable or unspecified: Secondary | ICD-10-CM | POA: Diagnosis not present

## 2021-05-24 DIAGNOSIS — O99214 Obesity complicating childbirth: Secondary | ICD-10-CM | POA: Diagnosis not present

## 2021-05-24 DIAGNOSIS — F419 Anxiety disorder, unspecified: Secondary | ICD-10-CM | POA: Diagnosis not present

## 2021-05-24 DIAGNOSIS — Z23 Encounter for immunization: Secondary | ICD-10-CM | POA: Diagnosis not present

## 2021-05-24 DIAGNOSIS — D62 Acute posthemorrhagic anemia: Secondary | ICD-10-CM | POA: Diagnosis not present

## 2021-05-24 DIAGNOSIS — O321XX0 Maternal care for breech presentation, not applicable or unspecified: Secondary | ICD-10-CM | POA: Diagnosis not present

## 2021-05-24 LAB — TYPE AND SCREEN
ABO/RH(D): O POS
Antibody Screen: NEGATIVE

## 2021-05-24 LAB — CBC
HCT: 36.5 % (ref 36.0–46.0)
Hemoglobin: 11.6 g/dL — ABNORMAL LOW (ref 12.0–15.0)
MCH: 27.4 pg (ref 26.0–34.0)
MCHC: 31.8 g/dL (ref 30.0–36.0)
MCV: 86.1 fL (ref 80.0–100.0)
Platelets: 343 10*3/uL (ref 150–400)
RBC: 4.24 MIL/uL (ref 3.87–5.11)
RDW: 14.1 % (ref 11.5–15.5)
WBC: 11.2 10*3/uL — ABNORMAL HIGH (ref 4.0–10.5)
nRBC: 0 % (ref 0.0–0.2)

## 2021-05-24 LAB — SARS CORONAVIRUS 2 (TAT 6-24 HRS): SARS Coronavirus 2: NEGATIVE

## 2021-05-24 NOTE — Patient Instructions (Signed)
Margaret Mckinney  05/24/2021   Your procedure is scheduled on:  05/27/2021  Arrive at 0530 at Entrance C on [address] at [facility]  and [employer]. You are invited to use the FREE valet parking or use the Visitor's parking deck.  Pick up the phone at the desk and dial [phone].  Call this number if you have problems the morning of surgery: [phone]  Remember:   Do not eat food:(After Midnight) Desps de medianoche.  Do not drink clear liquids: (After Midnight) Desps de medianoche.  Take these medicines the morning of surgery with A SIP OF WATER:  none   Do not wear jewelry, make-up or nail polish.  Do not wear lotions, powders, or perfumes. Do not wear deodorant.  Do not shave 48 hours prior to surgery.  Do not bring valuables to the hospital.  [facility] is not   responsible for any belongings or valuables brought to the hospital.  Contacts, dentures or bridgework may not be worn into surgery.  Leave suitcase in the car. After surgery it may be brought to your room.  For patients admitted to the hospital, checkout time is 11:00 AM the day of              discharge.      Please read over the following fact sheets that you were given:     Preparing for Surgery

## 2021-05-25 LAB — RPR: RPR Ser Ql: NONREACTIVE

## 2021-05-27 ENCOUNTER — Inpatient Hospital Stay (HOSPITAL_COMMUNITY): Payer: BC Managed Care – PPO | Admitting: Certified Registered Nurse Anesthetist

## 2021-05-27 ENCOUNTER — Encounter (HOSPITAL_COMMUNITY)
Admission: RE | Disposition: A | Discharge status: Home / Self Care | Payer: Self-pay | Source: Ambulatory Visit | Attending: Obstetrics and Gynecology

## 2021-05-27 ENCOUNTER — Other Ambulatory Visit: Payer: Self-pay

## 2021-05-27 ENCOUNTER — Inpatient Hospital Stay (HOSPITAL_COMMUNITY)
Admission: RE | Admit: 2021-05-27 | Discharge: 2021-05-30 | DRG: 787 | Disposition: A | Discharge status: Home / Self Care | Payer: BC Managed Care – PPO | Attending: Obstetrics and Gynecology | Admitting: Obstetrics and Gynecology

## 2021-05-27 ENCOUNTER — Encounter (HOSPITAL_COMMUNITY): Payer: Self-pay | Admitting: Obstetrics and Gynecology

## 2021-05-27 DIAGNOSIS — O99344 Other mental disorders complicating childbirth: Secondary | ICD-10-CM | POA: Diagnosis present

## 2021-05-27 DIAGNOSIS — Z3A39 39 weeks gestation of pregnancy: Secondary | ICD-10-CM | POA: Diagnosis not present

## 2021-05-27 DIAGNOSIS — O9902 Anemia complicating childbirth: Secondary | ICD-10-CM | POA: Diagnosis present

## 2021-05-27 DIAGNOSIS — F419 Anxiety disorder, unspecified: Secondary | ICD-10-CM | POA: Diagnosis present

## 2021-05-27 DIAGNOSIS — O99214 Obesity complicating childbirth: Secondary | ICD-10-CM | POA: Diagnosis present

## 2021-05-27 DIAGNOSIS — D649 Anemia, unspecified: Secondary | ICD-10-CM | POA: Diagnosis present

## 2021-05-27 DIAGNOSIS — Z8659 Personal history of other mental and behavioral disorders: Secondary | ICD-10-CM

## 2021-05-27 DIAGNOSIS — Z87891 Personal history of nicotine dependence: Secondary | ICD-10-CM

## 2021-05-27 DIAGNOSIS — O321XX0 Maternal care for breech presentation, not applicable or unspecified: Secondary | ICD-10-CM | POA: Diagnosis present

## 2021-05-27 DIAGNOSIS — O328XX0 Maternal care for other malpresentation of fetus, not applicable or unspecified: Principal | ICD-10-CM | POA: Diagnosis present

## 2021-05-27 DIAGNOSIS — D62 Acute posthemorrhagic anemia: Secondary | ICD-10-CM | POA: Diagnosis not present

## 2021-05-27 HISTORY — DX: Other specified behavioral and emotional disorders with onset usually occurring in childhood and adolescence: F98.8

## 2021-05-27 HISTORY — DX: Anxiety disorder, unspecified: F41.9

## 2021-05-27 LAB — COMPREHENSIVE METABOLIC PANEL
ALT: 15 U/L (ref 0–44)
AST: 18 U/L (ref 15–41)
Albumin: 2.3 g/dL — ABNORMAL LOW (ref 3.5–5.0)
Alkaline Phosphatase: 135 U/L — ABNORMAL HIGH (ref 38–126)
Anion gap: 7 (ref 5–15)
BUN: 7 mg/dL (ref 6–20)
CO2: 22 mmol/L (ref 22–32)
Calcium: 8.7 mg/dL — ABNORMAL LOW (ref 8.9–10.3)
Chloride: 107 mmol/L (ref 98–111)
Creatinine, Ser: 0.7 mg/dL (ref 0.44–1.00)
GFR, Estimated: >60 mL/min (ref >60)
Glucose, Bld: 106 mg/dL — ABNORMAL HIGH (ref 70–99)
Potassium: 5.1 mmol/L (ref 3.5–5.1)
Sodium: 136 mmol/L (ref 135–145)
Total Bilirubin: 0.4 mg/dL (ref 0.3–1.2)
Total Protein: 5.6 g/dL — ABNORMAL LOW (ref 6.5–8.1)

## 2021-05-27 SURGERY — Surgical Case
Anesthesia: Spinal

## 2021-05-27 MED ORDER — DIPHENHYDRAMINE HCL 25 MG PO CAPS: 25.0000 mg | ORAL_CAPSULE | Freq: Four times a day (QID) | ORAL | Status: DC | PRN

## 2021-05-27 MED ORDER — ACETAMINOPHEN 10 MG/ML IV SOLN: 1000.0000 mg | Freq: Once | INTRAVENOUS | Status: DC | PRN

## 2021-05-27 MED ORDER — LACTATED RINGERS IV SOLN: INTRAVENOUS | Status: DC

## 2021-05-27 MED ORDER — ACETAMINOPHEN 500 MG PO TABS: 1000.0000 mg | ORAL_TABLET | Freq: Four times a day (QID) | ORAL | Status: DC

## 2021-05-27 MED ORDER — ACETAMINOPHEN 500 MG PO TABS
1000.0000 mg | ORAL_TABLET | Freq: Four times a day (QID) | ORAL | Status: DC
  Administered 2021-05-27 – 2021-05-30 (×11): 1000 mg via ORAL
  Filled 2021-05-27 (×12): qty 2

## 2021-05-27 MED ORDER — MENTHOL 3 MG MT LOZG: 1.0000 | LOZENGE | OROMUCOSAL | Status: DC | PRN

## 2021-05-27 MED ORDER — MORPHINE SULFATE (PF) 0.5 MG/ML IJ SOLN
INTRAMUSCULAR | Status: AC
  Filled 2021-05-27: qty 10

## 2021-05-27 MED ORDER — NALOXONE HCL 0.4 MG/ML IJ SOLN: 0.4000 mg | INTRAMUSCULAR | Status: DC | PRN

## 2021-05-27 MED ORDER — FENTANYL CITRATE (PF) 100 MCG/2ML IJ SOLN
INTRAMUSCULAR | Status: DC | PRN
  Administered 2021-05-27: 15 ug via INTRATHECAL

## 2021-05-27 MED ORDER — OXYCODONE HCL 5 MG PO TABS
5.0000 mg | ORAL_TABLET | ORAL | Status: DC | PRN
  Administered 2021-05-27 – 2021-05-29 (×7): 5 mg via ORAL
  Administered 2021-05-29 (×2): 10 mg via ORAL
  Administered 2021-05-29: 5 mg via ORAL
  Administered 2021-05-30 (×3): 10 mg via ORAL
  Filled 2021-05-27: qty 2
  Filled 2021-05-27 (×2): qty 1
  Filled 2021-05-27: qty 2
  Filled 2021-05-27 (×2): qty 1
  Filled 2021-05-27: qty 2
  Filled 2021-05-27 (×2): qty 1
  Filled 2021-05-27: qty 2
  Filled 2021-05-27 (×4): qty 1

## 2021-05-27 MED ORDER — STERILE WATER FOR IRRIGATION IR SOLN
Status: DC | PRN
  Administered 2021-05-27: 1

## 2021-05-27 MED ORDER — PHENYLEPHRINE 40 MCG/ML (10ML) SYRINGE FOR IV PUSH (FOR BLOOD PRESSURE SUPPORT)
PREFILLED_SYRINGE | INTRAVENOUS | Status: AC
  Filled 2021-05-27: qty 10

## 2021-05-27 MED ORDER — NALBUPHINE HCL 10 MG/ML IJ SOLN
5.0000 mg | Freq: Once | INTRAMUSCULAR | Status: DC | PRN
Start: 2021-05-27 — End: 2021-05-30

## 2021-05-27 MED ORDER — SCOPOLAMINE 1 MG/3DAYS TD PT72
MEDICATED_PATCH | TRANSDERMAL | Status: AC
  Filled 2021-05-27: qty 1

## 2021-05-27 MED ORDER — NALOXONE HCL 4 MG/10ML IJ SOLN
1.0000 ug/kg/h | INTRAVENOUS | Status: DC | PRN
  Filled 2021-05-27: qty 5

## 2021-05-27 MED ORDER — FENTANYL CITRATE (PF) 100 MCG/2ML IJ SOLN
INTRAMUSCULAR | Status: AC
  Filled 2021-05-27: qty 2

## 2021-05-27 MED ORDER — COCONUT OIL OIL
1.0000 "application " | TOPICAL_OIL | Status: DC | PRN
  Administered 2021-05-29: 1 via TOPICAL

## 2021-05-27 MED ORDER — PHENYLEPHRINE HCL-NACL 20-0.9 MG/250ML-% IV SOLN
INTRAVENOUS | Status: AC
  Filled 2021-05-27: qty 250

## 2021-05-27 MED ORDER — NALBUPHINE HCL 10 MG/ML IJ SOLN: 5.0000 mg | Freq: Once | INTRAMUSCULAR | Status: DC | PRN

## 2021-05-27 MED ORDER — TETANUS-DIPHTH-ACELL PERTUSSIS 5-2.5-18.5 LF-MCG/0.5 IM SUSY: 0.5000 mL | PREFILLED_SYRINGE | Freq: Once | INTRAMUSCULAR | Status: DC

## 2021-05-27 MED ORDER — SODIUM CHLORIDE 0.9% FLUSH: 3.0000 mL | INTRAVENOUS | Status: DC | PRN

## 2021-05-27 MED ORDER — DIPHENHYDRAMINE HCL 50 MG/ML IJ SOLN: 12.5000 mg | INTRAMUSCULAR | Status: DC | PRN

## 2021-05-27 MED ORDER — BUPIVACAINE IN DEXTROSE 0.75-8.25 % IT SOLN
INTRATHECAL | Status: DC | PRN
  Administered 2021-05-27: 1.6 mL via INTRATHECAL

## 2021-05-27 MED ORDER — NALBUPHINE HCL 10 MG/ML IJ SOLN: 5.0000 mg | INTRAMUSCULAR | Status: DC | PRN

## 2021-05-27 MED ORDER — DIPHENHYDRAMINE HCL 25 MG PO CAPS: 25.0000 mg | ORAL_CAPSULE | ORAL | Status: DC | PRN

## 2021-05-27 MED ORDER — MORPHINE SULFATE (PF) 0.5 MG/ML IJ SOLN: INTRAMUSCULAR | Status: DC | PRN

## 2021-05-27 MED ORDER — FENTANYL CITRATE (PF) 100 MCG/2ML IJ SOLN: 25.0000 ug | INTRAMUSCULAR | Status: DC | PRN

## 2021-05-27 MED ORDER — DIBUCAINE (PERIANAL) 1 % EX OINT: 1.0000 "application " | TOPICAL_OINTMENT | CUTANEOUS | Status: DC | PRN

## 2021-05-27 MED ORDER — SENNOSIDES-DOCUSATE SODIUM 8.6-50 MG PO TABS
2.0000 | ORAL_TABLET | Freq: Every day | ORAL | Status: DC
  Administered 2021-05-28 – 2021-05-30 (×3): 2 via ORAL
  Filled 2021-05-27 (×3): qty 2

## 2021-05-27 MED ORDER — CEFAZOLIN IN SODIUM CHLORIDE 3-0.9 GM/100ML-% IV SOLN
INTRAVENOUS | Status: AC
  Filled 2021-05-27: qty 100

## 2021-05-27 MED ORDER — WITCH HAZEL-GLYCERIN EX PADS: 1.0000 "application " | MEDICATED_PAD | CUTANEOUS | Status: DC | PRN

## 2021-05-27 MED ORDER — ACETAMINOPHEN 10 MG/ML IV SOLN
INTRAVENOUS | Status: DC | PRN
  Administered 2021-05-27: 1000 mg via INTRAVENOUS

## 2021-05-27 MED ORDER — SCOPOLAMINE 1 MG/3DAYS TD PT72
1.0000 | MEDICATED_PATCH | Freq: Once | TRANSDERMAL | Status: AC
  Administered 2021-05-27: 1 via TRANSDERMAL

## 2021-05-27 MED ORDER — PHENYLEPHRINE HCL-NACL 20-0.9 MG/250ML-% IV SOLN
INTRAVENOUS | Status: DC | PRN
  Administered 2021-05-27: 60 ug/min via INTRAVENOUS

## 2021-05-27 MED ORDER — DEXAMETHASONE SODIUM PHOSPHATE 10 MG/ML IJ SOLN
INTRAMUSCULAR | Status: AC
  Filled 2021-05-27: qty 1

## 2021-05-27 MED ORDER — PHENYLEPHRINE 40 MCG/ML (10ML) SYRINGE FOR IV PUSH (FOR BLOOD PRESSURE SUPPORT)
PREFILLED_SYRINGE | INTRAVENOUS | Status: DC | PRN
  Administered 2021-05-27: 120 ug via INTRAVENOUS
  Administered 2021-05-27 (×2): 80 ug via INTRAVENOUS

## 2021-05-27 MED ORDER — OXYTOCIN-SODIUM CHLORIDE 30-0.9 UT/500ML-% IV SOLN
INTRAVENOUS | Status: DC | PRN
  Administered 2021-05-27: 100 mL via INTRAVENOUS
  Administered 2021-05-27: 300 mL via INTRAVENOUS

## 2021-05-27 MED ORDER — DEXAMETHASONE SODIUM PHOSPHATE 10 MG/ML IJ SOLN
INTRAMUSCULAR | Status: DC | PRN
  Administered 2021-05-27: 10 mg via INTRAVENOUS

## 2021-05-27 MED ORDER — OXYTOCIN-SODIUM CHLORIDE 30-0.9 UT/500ML-% IV SOLN: 2.5000 [IU]/h | INTRAVENOUS | Status: AC

## 2021-05-27 MED ORDER — POVIDONE-IODINE 10 % EX SWAB
2.0000 "application " | Freq: Once | CUTANEOUS | Status: AC
  Administered 2021-05-27: 2 via TOPICAL

## 2021-05-27 MED ORDER — OXYTOCIN-SODIUM CHLORIDE 30-0.9 UT/500ML-% IV SOLN
INTRAVENOUS | Status: AC
  Filled 2021-05-27: qty 500

## 2021-05-27 MED ORDER — SIMETHICONE 80 MG PO CHEW: 80.0000 mg | CHEWABLE_TABLET | ORAL | Status: DC | PRN

## 2021-05-27 MED ORDER — PRENATAL MULTIVITAMIN CH
1.0000 | ORAL_TABLET | Freq: Every day | ORAL | Status: DC
  Administered 2021-05-28 – 2021-05-30 (×3): 1 via ORAL
  Filled 2021-05-27 (×3): qty 1

## 2021-05-27 MED ORDER — SIMETHICONE 80 MG PO CHEW
80.0000 mg | CHEWABLE_TABLET | Freq: Three times a day (TID) | ORAL | Status: DC
  Administered 2021-05-27 – 2021-05-30 (×7): 80 mg via ORAL
  Filled 2021-05-27 (×8): qty 1

## 2021-05-27 MED ORDER — ONDANSETRON HCL 4 MG/2ML IJ SOLN: 4.0000 mg | Freq: Three times a day (TID) | INTRAMUSCULAR | Status: DC | PRN

## 2021-05-27 MED ORDER — CEFAZOLIN IN SODIUM CHLORIDE 3-0.9 GM/100ML-% IV SOLN
3.0000 g | INTRAVENOUS | Status: AC
  Administered 2021-05-27: 3 g via INTRAVENOUS

## 2021-05-27 MED ORDER — MORPHINE SULFATE (PF) 0.5 MG/ML IJ SOLN
INTRAMUSCULAR | Status: DC | PRN
  Administered 2021-05-27: .15 mg via INTRATHECAL

## 2021-05-27 MED ORDER — ONDANSETRON HCL 4 MG/2ML IJ SOLN
INTRAMUSCULAR | Status: DC | PRN
  Administered 2021-05-27: 4 mg via INTRAVENOUS

## 2021-05-27 MED ORDER — ACETAMINOPHEN 10 MG/ML IV SOLN
INTRAVENOUS | Status: AC
  Filled 2021-05-27: qty 100

## 2021-05-27 SURGICAL SUPPLY — 41 items
APL SKNCLS STERI-STRIP NONHPOA (GAUZE/BANDAGES/DRESSINGS) ×1
BENZOIN TINCTURE PRP APPL 2/3 (GAUZE/BANDAGES/DRESSINGS) ×2 IMPLANT
CANISTER WOUND CARE 500ML ATS (WOUND CARE) ×2 IMPLANT
CHLORAPREP W/TINT 26ML (MISCELLANEOUS) ×2 IMPLANT
CLAMP CORD UMBIL (MISCELLANEOUS) IMPLANT
CLOTH BEACON ORANGE TIMEOUT ST (SAFETY) ×2 IMPLANT
DRESSING PREVENA PLUS CUSTOM (GAUZE/BANDAGES/DRESSINGS) ×1 IMPLANT
DRSG OPSITE POSTOP 4X10 (GAUZE/BANDAGES/DRESSINGS) ×2 IMPLANT
DRSG PREVENA PLUS CUSTOM (GAUZE/BANDAGES/DRESSINGS) ×2
ELECT REM PT RETURN 9FT ADLT (ELECTROSURGICAL) ×2
ELECTRODE REM PT RTRN 9FT ADLT (ELECTROSURGICAL) ×1 IMPLANT
EXTRACTOR VACUUM KIWI (MISCELLANEOUS) ×2 IMPLANT
GLOVE BIOGEL PI IND STRL 6.5 (GLOVE) ×1 IMPLANT
GLOVE BIOGEL PI IND STRL 7.0 (GLOVE) ×2 IMPLANT
GLOVE BIOGEL PI INDICATOR 6.5 (GLOVE) ×1
GLOVE BIOGEL PI INDICATOR 7.0 (GLOVE) ×2
GLOVE ECLIPSE 6.0 STRL STRAW (GLOVE) ×2 IMPLANT
GLOVE SURG ENC TEXT LTX SZ6.5 (GLOVE) ×2 IMPLANT
GOWN STRL REUS W/TWL LRG LVL3 (GOWN DISPOSABLE) ×4 IMPLANT
KIT ABG SYR 3ML LUER SLIP (SYRINGE) IMPLANT
NEEDLE HYPO 25X5/8 SAFETYGLIDE (NEEDLE) IMPLANT
NS IRRIG 1000ML POUR BTL (IV SOLUTION) ×2 IMPLANT
PACK C SECTION WH (CUSTOM PROCEDURE TRAY) ×2 IMPLANT
PAD OB MATERNITY 4.3X12.25 (PERSONAL CARE ITEMS) ×2 IMPLANT
PENCIL SMOKE EVAC W/HOLSTER (ELECTROSURGICAL) ×2 IMPLANT
RETRACTOR TRAXI PANNICULUS (MISCELLANEOUS) ×1 IMPLANT
RETRACTOR WND ALEXIS 25 LRG (MISCELLANEOUS) IMPLANT
RTRCTR WOUND ALEXIS 25CM LRG (MISCELLANEOUS)
STRIP CLOSURE SKIN 1/2X4 (GAUZE/BANDAGES/DRESSINGS) IMPLANT
SUT MNCRL 0 VIOLET CTX 36 (SUTURE) ×2 IMPLANT
SUT MONOCRYL 0 CTX 36 (SUTURE) ×4
SUT PLAIN 2 0 XLH (SUTURE) ×2 IMPLANT
SUT VIC AB 0 CT1 36 (SUTURE) ×4 IMPLANT
SUT VIC AB 3-0 CT1 27 (SUTURE)
SUT VIC AB 3-0 CT1 TAPERPNT 27 (SUTURE) IMPLANT
SUT VIC AB 4-0 KS 27 (SUTURE) ×2 IMPLANT
SUT VIC AB 4-0 PS2 27 (SUTURE) ×2 IMPLANT
TOWEL OR 17X24 6PK STRL BLUE (TOWEL DISPOSABLE) ×2 IMPLANT
TRAXI PANNICULUS RETRACTOR (MISCELLANEOUS) ×1
TRAY FOLEY W/BAG SLVR 14FR LF (SET/KITS/TRAYS/PACK) IMPLANT
WATER STERILE IRR 1000ML POUR (IV SOLUTION) ×2 IMPLANT

## 2021-05-27 NOTE — Op Note (Signed)
Cesarean Section Procedure Note   Margaret Mckinney  05/27/2021  Indications: Breech Presentation, footling breech  Pre-operative Diagnosis: Breech Presentation, term, bmi >40 Procedure: 1tlcs Post-operative Diagnosis: Same   Surgeon: Surgeon(s) and Role:    * Christien Frankl, Candace Gallus, MD - Primary   Assistants: Carlean Jews, CNM  Anesthesia: spinal   Procedure Details:  The patient was seen in the Holding Room. The risks, benefits, complications, treatment options, and expected outcomes were discussed with the patient. The patient concurred with the proposed plan, giving informed consent. identified as [name] and the procedure verified as C-Section Delivery. A Time Out was held and the above information confirmed.  After induction of anesthesia, the patient was draped and prepped in the usual sterile manner, foley was draining urine well.  A pfannenstiel incision was made and carried down through the subcutaneous tissue to the fascia. Fascial incision was made and extended transversely. The fascia was separated from the underlying rectus tissue superiorly and inferiorly. The peritoneum was identified and entered. Peritoneal incision was extended longitudinally. Alexis-O retractor placed. The utero-vesical peritoneal reflection was incised transversely and the bladder flap was bluntly freed from the lower uterine segment. A low transverse uterine incision was made. Delivered from breech (footling) presentation was a viable female infant with vigorous cry. Apgar scores of 8 at one minute and 8 at five minutes. Delayed cord clamping done at 1 minute and baby handed to NICU team in attendance. Cord ph was sent and 7.3. Cord blood was obtained for evaluation. The placenta was removed Intact and appeared normal. The uterine outline, tubes and ovaries appeared normal}. The uterine incision was closed with running locked sutures of . A second imbricating layer was then sutured.   Hemostasis  was observed. Alexis retractor removed. Peritoneal closure done with 2-0 Vicryl.  The fascia was then reapproximated with running sutures of 0Vicryl. The subcuticular closure was performed using 2-0plain gut. The skin was closed with 4-0Vicryl. Excellent hemostasis was continued to be noted.  Instrument, sponge, and needle counts were correct prior the abdominal closure and were correct at the conclusion of the case.    Findings:viable female infant, apgars:  8/8 ; normal appearing uterus, nml tubes/ovaries bilaterally   Estimated Blood Loss: per titron   Total IV Fluids:   Urine Output:  755CC OF clear urine  Specimens: none  Complications: no complications  Disposition: PACU - hemodynamically stable.   Maternal Condition: stable   Baby condition / location:  Couplet care / Skin to Skin  Attending Attestation: I performed the procedure.   Signed: Surgeon(s): Amado Nash Candace Gallus, MD

## 2021-05-27 NOTE — Lactation Note (Signed)
This note was copied from a baby's chart. Lactation Consultation Note  Patient Name: Margaret Mckinney [id number] Date: 05/27/2021 Reason for consult: Initial assessment;Mother's request;Primapara;1st time breastfeeding;Term Age:29 hours  LC assisted with latching following hand expressing and spoon feeding infant 54ml of colostrum.  Infant heart shaped tongue and anterior lingual attachment. LC can feel tongue on base of finger with suck training. Mom denied any pain with the latch.   Plan 1. To feed based on cues 8-12x 24hr period. Mom to offer breasts and look for signs of milk transfer.  2. If unable to latch, Mom to offer EBM via spoon.  3. I and O sheet reviewed.  All questions answered at the end of the visit.   Maternal Data Has patient been taught Hand Expression?: Yes Does the patient have breastfeeding experience prior to this delivery?: No  Feeding Mother's Current Feeding Choice: Breast Milk  LATCH Score Latch: Repeated attempts needed to sustain latch, nipple held in mouth throughout feeding, stimulation needed to elicit sucking reflex.  Audible Swallowing: Spontaneous and intermittent  Type of Nipple: Everted at rest and after stimulation  Comfort (Breast/Nipple): Soft / non-tender  Hold (Positioning): Assistance needed to correctly position infant at breast and maintain latch.  LATCH Score: 8   Lactation Tools Discussed/Used    Interventions Interventions: Breast feeding basics reviewed;Support pillows;Education;Assisted with latch;Position options;Skin to skin;Expressed milk;Breast massage;LC Services brochure;Hand express;Breast compression;Adjust position;Visual merchandiser education  Discharge    Consult Status Consult Status: Follow-up Date: 05/28/21 Follow-up type: In-patient    Roland Lipke  Nicholson-Springer 05/27/2021, 4:17 PM

## 2021-05-27 NOTE — Anesthesia Preprocedure Evaluation (Addendum)
Anesthesia Evaluation  Patient identified by MRN, date of birth, ID band Patient awake    Reviewed: Allergy & Precautions, NPO status , Patient's Chart, lab work & pertinent test results  History of Anesthesia Complications Negative for: history of anesthetic complications  Airway Mallampati: III  TM Distance: >3 FB Neck ROM: Full    Dental no notable dental hx.    Pulmonary former smoker,    Pulmonary exam normal breath sounds clear to auscultation       Cardiovascular negative cardio ROS Normal cardiovascular exam Rhythm:Regular Rate:Normal     Neuro/Psych Anxiety ADDnegative neurological ROS     GI/Hepatic negative GI ROS, Neg liver ROS,   Endo/Other  Morbid obesity (BMI 46)  Renal/GU negative Renal ROS  negative genitourinary   Musculoskeletal negative musculoskeletal ROS (+)   Abdominal   Peds  Hematology Hgb 11.6, Plt 343k   Anesthesia Other Findings Day of surgery medications reviewed with patient.  Reproductive/Obstetrics (+) Pregnancy (breech presentation)                            Anesthesia Physical Anesthesia Plan  ASA: 3  Anesthesia Plan: Spinal   Post-op Pain Management:    Induction:   PONV Risk Score and Plan: 4 or greater and Treatment may vary due to age or medical condition, Ondansetron and Dexamethasone  Airway Management Planned: Natural Airway  Additional Equipment: None  Intra-op Plan:   Post-operative Plan:   Informed Consent: I have reviewed the patients History and Physical, chart, labs and discussed the procedure including the risks, benefits and alternatives for the proposed anesthesia with the patient or authorized representative who has indicated his/her understanding and acceptance.     Dental advisory given  Plan Discussed with: CRNA  Anesthesia Plan Comments:        Anesthesia Quick Evaluation

## 2021-05-27 NOTE — Transfer of Care (Signed)
Immediate Anesthesia Transfer of Care Note  Patient: Margaret Mckinney  Procedure(s) Performed: Primary CESAREAN SECTION  Patient Location: PACU  Anesthesia Type:Spinal  Level of Consciousness: awake, alert , oriented and patient cooperative  Airway & Oxygen Therapy: Patient Spontanous Breathing and Patient connected to nasal cannula oxygen  Post-op Assessment: Report given to RN and Post -op Vital signs reviewed and stable  Post vital signs: Reviewed and stable  Last Vitals:  Vitals Value Taken Time  BP 131/68 05/27/21 0945  Temp    Pulse 77 05/27/21 0948  Resp 17 05/27/21 0948  SpO2 100 % 05/27/21 0948  Vitals shown include unvalidated device data.  Last Pain:  Vitals:   05/27/21 0609  TempSrc: Oral         Complications: No notable events documented.

## 2021-05-27 NOTE — Anesthesia Postprocedure Evaluation (Signed)
Anesthesia Post Note  Patient: Margaret Mckinney  Procedure(s) Performed: Primary CESAREAN SECTION     Patient location during evaluation: PACU Anesthesia Type: Spinal Level of consciousness: oriented and awake and alert Pain management: pain level controlled Vital Signs Assessment: post-procedure vital signs reviewed and stable Respiratory status: spontaneous breathing, respiratory function stable and patient connected to nasal cannula oxygen Cardiovascular status: blood pressure returned to baseline and stable Postop Assessment: no headache, no backache and no apparent nausea or vomiting Anesthetic complications: no   No notable events documented.  Last Vitals:  Vitals:   05/27/21 1030 05/27/21 1045  BP: 131/66   Pulse: 87 78  Resp: (!) 21 16  Temp: 36.6 C   SpO2: 99%     Last Pain:  Vitals:   05/27/21 1045  TempSrc:   PainSc: 3    Pain Goal:    LLE Motor Response: Purposeful movement (05/27/21 1045)   RLE Motor Response: Purposeful movement (05/27/21 1045)       Epidural/Spinal Function Cutaneous sensation: Able to Discern Pressure (05/27/21 1045), Patient able to flex knees: Yes (05/27/21 1045), Patient able to lift hips off bed: No (05/27/21 1045), Back pain beyond tenderness at insertion site: No (05/27/21 1045), Progressively worsening motor and/or sensory loss: No (05/27/21 1045), Bowel and/or bladder incontinence post epidural: No (05/27/21 1045)  [name]

## 2021-05-27 NOTE — H&P (Signed)
Margaret Mckinney is a 31 y.o. G1P0 at [redacted]w[redacted]d gestation presents for c/s d/t breech presentation.  Denies vb, lof, ctx.  +FM.  Antepartum course:  bmi >40,   h/o LEEP - nml CL; anxiety-vistaril prn; AGA: 75% (7'2") at 36.5 [facility] at [place] since 10 wks.  See complete pre-natal records  History OB History     Gravida  1   Para      Term      Preterm      AB      Living         SAB      IAB      Ectopic      Multiple      Live Births             Past Medical History:  Diagnosis Date   ADD (attention deficit disorder)    Anxiety    Past Surgical History:  Procedure Laterality Date   HAND SURGERY Right 2019   KNEE SURGERY Left 2006   LEEP  07/28/2017   Family History: family history includes Alopecia in her brother; Healthy in her father and mother; Rheum arthritis in her maternal aunt and maternal grandmother. Social History:  reports that she quit smoking about a year ago. Her smoking use included cigarettes. She has never used smokeless tobacco. She reports current alcohol use. She reports that she does not use drugs.  ROS: See above otherwise negative  Prenatal labs:  ABO, Rh: --/--/O POS (10/28 1122) Antibody: NEG (10/28 1122) Rubella: Immune (04/13 0000) RPR: NON REACTIVE (10/28 1218)  HBsAg: Negative (04/13 0000)  HIV:Non-reactive (04/13 0000)  GBS: Negative/-- (10/18 0000)  1 hr Glucola: Normal Genetic screening: Normal Anatomy US: Normal  Physical Exam:     Blood pressure (!) 138/94, pulse 92, temperature 97.9 F (36.6 C), temperature source Oral, resp. rate 16, height 5\' 5"  (1.651 m), weight 124.7 kg, SpO2 99 %. A&O x 3 HEENT: Normal Lungs: CTAB CV: RRR Abdominal: Soft, Non-tender, Gravid, and Estimated fetal weight: 7.5-8 lbs  Lower Extremities: Non-edematous, Non-tender  Pelvic Exam:      deferred  Labs:  CBC:  Lab Results  Component Value Date   WBC 11.2 (H) 05/24/2021   RBC 4.24 05/24/2021   HGB 11.6 (L)  05/24/2021   HCT 36.5 05/24/2021   MCV 86.1 05/24/2021   MCH 27.4 05/24/2021   MCHC 31.8 05/24/2021   RDW 14.1 05/24/2021   PLT 343 05/24/2021   CMP:  Lab Results  Component Value Date   NA 140 08/08/2020   K 4.7 08/08/2020   CL 104 08/08/2020   CO2 28 08/08/2020   GLUCOSE 79 08/08/2020   BUN 13 08/08/2020   CREATININE 0.80 08/08/2020   CALCIUM 9.8 08/08/2020   PROT 7.2 08/08/2020   AST 13 08/08/2020   ALT 11 08/08/2020   ALBUMIN 3.8 01/26/2008   ALKPHOS 89 01/26/2008   BILITOT 0.2 08/08/2020   GFRNONAA 100 08/08/2020   GFRAA 116 08/08/2020   Urine: Lab Results  Component Value Date   COLORURINE YELLOW 08/08/2020   APPEARANCEUR CLEAR 08/08/2020   LABSPEC 1.025 08/08/2020   PHURINE 5.5 08/08/2020   GLUCOSEU NEGATIVE 08/08/2020   HGBUR NEGATIVE 08/08/2020   BILIRUBINUR NEGATIVE 01/26/2008   KETONESUR NEGATIVE 08/08/2020   PROTEINUR NEGATIVE 08/08/2020   NITRITE NEGATIVE 07/19/2019   LEUKOCYTESUR NEGATIVE 07/19/2019   FHT: 144  Prenatal Transfer Tool  Maternal Diabetes: No Genetic Screening: Normal Maternal Ultrasounds/Referrals: Normal Fetal Ultrasounds  or other Referrals:  None Maternal Substance Abuse:  No Significant Maternal Medications:  Meds include: Other: vistaril Significant Maternal Lab Results: Group B Strep negative  Bedside u/s: breech presentation  Assessment/Plan:  30 y.o. G1P0 at [redacted]w[redacted]d gestation   Breech: term; for c/s; reviewed risk of procedure including bleeding (possible need for transfusion), infection, injury to bowel/bladder/nerves/blood vessels, risk of further surgery, risk of anesthesia, risk of blood clot to leg/lung, consent signed and will proceed to OR GBS neg BMI >40 AGA Anxiety-vistaril prn H/o LEEP    Vick Frees 05/27/2021, 7:35 AM

## 2021-05-27 NOTE — Anesthesia Procedure Notes (Signed)
Spinal  Patient location during procedure: OR Start time: 05/27/2021 7:40 AM End time: 05/27/2021 7:46 AM Reason for block: surgical anesthesia Staffing Performed: anesthesiologist  Anesthesiologist: Elmer Picker, MD Preanesthetic Checklist Completed: patient identified, IV checked, risks and benefits discussed, surgical consent, monitors and equipment checked, pre-op evaluation and timeout performed Spinal Block Patient position: sitting Prep: DuraPrep and site prepped and draped Patient monitoring: cardiac monitor, continuous pulse ox and blood pressure Approach: midline Location: L3-4 Injection technique: single-shot Needle Needle type: Pencan  Needle gauge: 24 G Needle length: 9 cm Assessment Sensory level: T6 Events: CSF return Additional Notes Functioning IV was confirmed and monitors were applied. Sterile prep and drape, including hand hygiene and sterile gloves were used. The patient was positioned and the spine was prepped. The skin was anesthetized with lidocaine.  Free flow of clear CSF was obtained prior to injecting local anesthetic into the CSF.  The spinal needle aspirated freely following injection.  The needle was carefully withdrawn.  The patient tolerated the procedure well.

## 2021-05-28 LAB — CBC
HCT: 29.2 % — ABNORMAL LOW (ref 36.0–46.0)
Hemoglobin: 9.6 g/dL — ABNORMAL LOW (ref 12.0–15.0)
MCH: 27.4 pg (ref 26.0–34.0)
MCHC: 32.9 g/dL (ref 30.0–36.0)
MCV: 83.2 fL (ref 80.0–100.0)
Platelets: 296 10*3/uL (ref 150–400)
RBC: 3.51 MIL/uL — ABNORMAL LOW (ref 3.87–5.11)
RDW: 14.1 % (ref 11.5–15.5)
WBC: 11.1 10*3/uL — ABNORMAL HIGH (ref 4.0–10.5)
nRBC: 0 % (ref 0.0–0.2)

## 2021-05-28 LAB — BIRTH TISSUE RECOVERY COLLECTION (PLACENTA DONATION)

## 2021-05-28 MED ORDER — MAGNESIUM OXIDE -MG SUPPLEMENT 400 (240 MG) MG PO TABS
400.0000 mg | ORAL_TABLET | Freq: Every day | ORAL | Status: DC
  Administered 2021-05-29 – 2021-05-30 (×2): 400 mg via ORAL
  Filled 2021-05-28 (×3): qty 1

## 2021-05-28 MED ORDER — POLYSACCHARIDE IRON COMPLEX 150 MG PO CAPS
150.0000 mg | ORAL_CAPSULE | Freq: Every day | ORAL | Status: DC
  Administered 2021-05-29 – 2021-05-30 (×2): 150 mg via ORAL
  Filled 2021-05-28 (×3): qty 1

## 2021-05-28 NOTE — Progress Notes (Addendum)
Pod 1  S:  Pt reports feeling well/ Tolerating po/ Voiding without problems/ No n/v/ Bleeding is spotting/ Pain controlled withibuprofen (OTC)     O:  A & O x 3 / VS: Blood pressure 128/79, pulse 86, temperature 98.2 F (36.8 C), temperature source Oral, resp. rate 18, height 5\' 5"  (1.651 m), weight 124.7 kg, SpO2 98 %, unknown if currently breastfeeding.  LABS:   I&O: I/O last 3 completed shifts: In: 2000 [I.V.:1800; IV Piggyback:200] Out: 3109 [Urine:2650; Blood:459]   No intake/output data recorded.  CBC    Component Value Date/Time   WBC 11.1 (H) 05/28/2021 0512   RBC 3.51 (L) 05/28/2021 0512   HGB 9.6 (L) 05/28/2021 0512   HCT 29.2 (L) 05/28/2021 0512   PLT 296 05/28/2021 0512   MCV 83.2 05/28/2021 0512   MCH 27.4 05/28/2021 0512   MCHC 32.9 05/28/2021 0512   RDW 14.1 05/28/2021 0512   LYMPHSABS 494 (L) 08/08/2020 1605   MONOABS 0.5 01/26/2008 2333   EOSABS 109 08/08/2020 1605   BASOSABS 21 08/08/2020 1605     Lungs: cta  Heart: rrr  Abdomen: soft ,nt, prevena dsg intact  Perineum: intact  Lochia: wnl  Extremities: neg c/c/e    A/P: PoD # 1 s/[ csection for breech            Anemia- start po fe  Doing well  Continue routine post partum orders

## 2021-05-28 NOTE — Progress Notes (Signed)
MOB was referred for history of depression/anxiety. * Referral screened out by Clinical Social Worker because none of the following criteria appear to apply: ~ History of anxiety/depression during this pregnancy, or of post-partum depression following prior delivery. ~ Diagnosis of anxiety and/or depression within last 3 years OR * MOB's symptoms currently being treated with medication and/or therapy. MOB has an active Rx for Vistaril.   Please contact the Clinical Social Worker if needs arise, by MOB request, or if MOB scores greater than 9/yes to question 10 on [place] Postpartum Depression Screen.  Yazlyn Wentzel Boyd-Gilyard, MSW, LCSW Clinical Social Work [phone]   

## 2021-05-28 NOTE — Lactation Note (Signed)
This note was copied from a baby's chart. Lactation Consultation Note  Patient Name: Margaret Mckinney ASTMH'D Date: 05/28/2021 Reason for consult: Follow-up assessment Age:29 hours  LC in to room for follow up. Mother requests assistance with latch upon arrival. Assisted with a deep latch, alignment and support pillows. Mother "sandwiches" breast for baby. Baby seems to be clusterfeeding. Reviewed normal behavior after 24 HOL, output and encouraged to offer expressed milk to "too infant up".    Plan: 1-Breastfeeding on demand or 8-12 times in 24h period. 2-Keep infant awake during breastfeeding session: massaging breast, infant's hand/shoulder/feet 3-Encourage hand expression/hand pump use for expressed breast milk to "top infant up" 4-Reinforce maternal rest, hydration and food intake.   Contact LC as needed for feeds/support/concerns/questions. All questions answered at this time.    Maternal Data Has patient been taught Hand Expression?: Yes  Feeding Mother's Current Feeding Choice: Breast Milk and Formula  LATCH Score Latch: Grasps breast easily, tongue down, lips flanged, rhythmical sucking.  Audible Swallowing: Spontaneous and intermittent  Type of Nipple: Everted at rest and after stimulation (short shafted)  Comfort (Breast/Nipple): Soft / non-tender  Hold (Positioning): Assistance needed to correctly position infant at breast and maintain latch.  LATCH Score: 9  Interventions Interventions: Breast feeding basics reviewed;Assisted with latch;Hand express;Breast compression;Adjust position;Support pillows;Expressed milk;Breast massage  Consult Status Consult Status: Follow-up Date: 05/29/21 Follow-up type: In-patient    Desirie Minteer A Higuera Ancidey 05/28/2021, 8:49 PM

## 2021-05-29 ENCOUNTER — Encounter (HOSPITAL_COMMUNITY): Payer: Self-pay | Admitting: Obstetrics and Gynecology

## 2021-05-29 NOTE — Lactation Note (Signed)
This note was copied from a baby's chart. Lactation Consultation Note  Patient Name: Margaret Mckinney [id number] Date: 05/29/2021 Reason for consult: Follow-up assessment;Infant weight loss Age:29 hours  LC in to room for follow up. LC assisted with latch. Reinforced deep latch, alignment and support pillows. Mother states breastfeeding is going well, milk is transitioning and breasts are filling up.  Infant is clusterfeeding. Provided hand pump, demonstrated use, care and milk storage explained.    Plan: 1-Breastfeeding on demand or 8-12 times in 24h period. 2-Keep infant awake during breastfeeding session: massaging breast, infant's hand/shoulder/feet 3-Encourage hand expression/hand pump use for expressed breast milk to "top infant up" 4-Reinforce maternal rest, hydration and food intake.   Contact LC as needed for feeds/support/concerns/questions. All questions answered at this time.    Maternal Data Has patient been taught Hand Expression?: Yes  Feeding Mother's Current Feeding Choice: Breast Milk  LATCH Score Latch: Grasps breast easily, tongue down, lips flanged, rhythmical sucking.  Audible Swallowing: Spontaneous and intermittent  Type of Nipple: Everted at rest and after stimulation  Comfort (Breast/Nipple): Soft / non-tender  Hold (Positioning): Assistance needed to correctly position infant at breast and maintain latch.  LATCH Score: 9   Lactation Tools Discussed/Used Tools: Pump;Flanges Flange Size: 27 Breast pump type: Manual Pump Education: Setup, frequency, and cleaning;Milk Storage Reason for Pumping: mother's request Pumping frequency: as needed for supplementation  Interventions Interventions: Assisted with latch;Breast feeding basics reviewed;Skin to skin;Hand express;Breast compression;Adjust position;Hand pump;Expressed milk;Position options;Support pillows;Education  Discharge Discharge Education: Engorgement and breast care Pump:  Manual;Personal  Consult Status Consult Status: Follow-up Date: 05/30/21 Follow-up type: In-patient    Anisha Starliper A Higuera Ancidey 05/29/2021, 6:30 PM

## 2021-05-29 NOTE — Lactation Note (Signed)
This note was copied from a baby's chart. Lactation Consultation Note  Patient Name: Margaret Mckinney RUEAV'W Date: 05/29/2021 Reason for consult: Breastfeeding assistance Age:29 hours  LC in to room to assist with manual pump. Mother is to use 24-mm flange to left breast and 27-mm flange to right breast. Collected ~15-mL. Encouraged mother to "top up" with expressed breast milk. Provided volume guidelines per age with breastfeeding.  Assisted with latch to left breast. Mother "sandwiches" breast, wide gape and deep latch. Mother has great positioning and neck/back support.  LC provided list of tongue tie resources.   Plan: 1-Breastfeeding on demand or 8-12 times in 24h period. 2-Keep infant awake during breastfeeding session: massaging breast, infant's hand/shoulder/feet 3-"top infant up" with expressed breast milk 4-Pump use after each feeding  5-Reinforce maternal rest, hydration and food intake.    Contact LC as needed for feeds/support/concerns/questions. All questions answered at this time.    Maternal Data Has patient been taught Hand Expression?: Yes  Feeding Mother's Current Feeding Choice: Breast Milk  LATCH Score Latch: Grasps breast easily, tongue down, lips flanged, rhythmical sucking.  Audible Swallowing: Spontaneous and intermittent  Type of Nipple: Everted at rest and after stimulation  Comfort (Breast/Nipple): Soft / non-tender  Hold (Positioning): Assistance needed to correctly position infant at breast and maintain latch.  LATCH Score: 9   Lactation Tools Discussed/Used Tools: Pump;Flanges Flange Size: 24;27 (left breast 64mm; right breast 46mm) Breast pump type: Manual Pump Education: Milk Storage;Setup, frequency, and cleaning Reason for Pumping: mother's request Pumping frequency: after feedings Pumped volume: 15 mL  Interventions Interventions: Breast feeding basics reviewed;Expressed milk;Hand pump;Hand express;Coconut oil;Education;Pace  feeding  Discharge Discharge Education: Engorgement and breast care Pump: Manual;Personal  Consult Status Consult Status: Follow-up Date: 05/30/21 Follow-up type: In-patient    Fey Coghill A Higuera Ancidey 05/29/2021, 8:08 PM

## 2021-05-29 NOTE — Progress Notes (Signed)
No c/o, tol po, ambulating, +flatus, voiding w/o problem; breastfeeding No lightheadedness/dizziness; nml lochia  Patient Vitals for the past 24 hrs:  BP Temp Temp src Pulse Resp SpO2  05/29/21 0514 124/77 98.4 F (36.9 C) Oral 85 16 100 %  05/28/21 2126 138/77 98.2 F (36.8 C) Oral 82 18 98 %  05/28/21 1525 125/84 98.3 F (36.8 C) Oral 90 18 98 %   A&ox3 Rrr Ctab Abd: +bs, soft, nt, nd; fundus firm and below umb; provena dressing in place LE: no edema, nt bilat  CBC Latest Ref Rng & Units 05/28/2021 05/24/2021 08/08/2020  WBC 4.0 - 10.5 K/uL 11.1(H) 11.2(H) 3.5(L)  Hemoglobin 12.0 - 15.0 g/dL 9.9(H) 11.6(L) 12.6  Hematocrit 36.0 - 46.0 % 29.2(L) 36.5 36.5  Platelets 150 - 400 K/uL 296 343 277   A/P: pod 2 s/p 1ltcs for breech Doing well - plan d/c home tomorrow for baby to work on feeding Chronic anemia of pregnancy with acute change d/t blood loss - asymptomatic, iron daily pp Bmi>40 - encourage ambulation RH pos RI Breastfeeding Anxiety - vistaril prn; follow closely pp; plan 2 wk f/u

## 2021-05-30 MED ORDER — COCONUT OIL OIL: 1.0000 "application " | TOPICAL_OIL | 0 refills | Status: DC | PRN

## 2021-05-30 MED ORDER — OXYCODONE HCL 5 MG PO TABS: 5.0000 mg | ORAL_TABLET | Freq: Four times a day (QID) | ORAL | 0 refills | Status: AC | PRN

## 2021-05-30 MED ORDER — SIMETHICONE 80 MG PO CHEW: 80.0000 mg | CHEWABLE_TABLET | ORAL | 0 refills | Status: DC | PRN

## 2021-05-30 MED ORDER — POLYSACCHARIDE IRON COMPLEX 150 MG PO CAPS: 150.0000 mg | ORAL_CAPSULE | Freq: Every day | ORAL | Status: DC

## 2021-05-30 MED ORDER — ACETAMINOPHEN 500 MG PO TABS: 1000.0000 mg | ORAL_TABLET | Freq: Four times a day (QID) | ORAL | 0 refills | Status: DC

## 2021-05-30 MED ORDER — MAGNESIUM OXIDE -MG SUPPLEMENT 400 (240 MG) MG PO TABS: 400.0000 mg | ORAL_TABLET | Freq: Every day | ORAL | Status: DC

## 2021-05-30 NOTE — Discharge Summary (Signed)
OB Discharge Summary  Patient Name: Margaret Mckinney DOB: 1991-07-08 MRN: [id number]  Date of admission: 05/27/2021 Delivering provider: Rhoderick Moody E   Admitting diagnosis: Cesarean delivery indicated due to breech presentation [O32.1XX0] Intrauterine pregnancy: [redacted]w[redacted]d     Secondary diagnosis: Patient Active Problem List   Diagnosis Date Noted   Cesarean delivery indicated due to breech presentation 05/27/2021   Postpartum care following cesarean delivery 10/31 05/27/2021   Hx of anxiety disorder 05/27/2021   Anemia - IDA w/ ABL 01/18/2020   Attention deficit disorder 07/19/2019   Vitamin D deficiency 07/08/2017   Tobacco use 07/08/2017   BMI 37.0-37.9, adult 07/08/2017   Abnormal Pap smear of cervix 07/08/2017   Additional problems:none   Date of discharge: 05/30/2021   Discharge diagnosis: Principal Problem:   Postpartum care following cesarean delivery 10/31 Active Problems:   Anemia - IDA w/ ABL   Cesarean delivery indicated due to breech presentation   Hx of anxiety disorder                                                              Post partum procedures: none  Augmentation: N/A Pain control: Spinal  Laceration:None  Episiotomy:None  Complications: None  Hospital course:  Sceduled C/S   30 y.o. yo G1P1001 at [redacted]w[redacted]d was admitted to the hospital 05/27/2021 for scheduled cesarean section with the following indication:Malpresentation.Delivery details are as follows:  Membrane Rupture Time/Date: 8:24 AM ,05/27/2021   Delivery Method:C-Section, Low Transverse  Details of operation can be found in separate operative note.  Patient had an uncomplicated postpartum course.  She is ambulating, tolerating a regular diet, passing flatus, and urinating well. Patient is discharged home in stable condition on  05/30/21        Newborn Data: Birth date:05/27/2021  Birth time:8:26 AM  Gender:Female  Living status:Living  Apgars:8 ,8  Weight:4060 g     Physical exam   Vitals:   05/29/21 0514 05/29/21 1420 05/29/21 2231 05/30/21 0529  BP: 124/77 136/83 126/76 124/84  Pulse: 85  86 87  Resp: 16 18 18 18   Temp: 98.4 F (36.9 C) 98 F (36.7 C) 98.4 F (36.9 C) 98.1 F (36.7 C)  TempSrc: Oral Oral Axillary Oral  SpO2: 100% 100%    Weight:      Height:       General: alert, cooperative, and no distress Lochia: appropriate Uterine Fundus: firm Incision: Dressing is clean, dry, and intact, Prevena patent DVT Evaluation: No cords or calf tenderness. No significant calf/ankle edema. Labs: Lab Results  Component Value Date   WBC 11.1 (H) 05/28/2021   HGB 9.6 (L) 05/28/2021   HCT 29.2 (L) 05/28/2021   MCV 83.2 05/28/2021   PLT 296 05/28/2021   CMP Latest Ref Rng & Units 05/27/2021  Glucose 70 - 99 mg/dL 05/29/2021)  BUN 6 - 20 mg/dL 7  Creatinine 778(E - 4.23 mg/dL 5.36  Sodium 1.44 - 315 mmol/L 136  Potassium 3.5 - 5.1 mmol/L 5.1  Chloride 98 - 111 mmol/L 107  CO2 22 - 32 mmol/L 22  Calcium 8.9 - 10.3 mg/dL 400)  Total Protein 6.5 - 8.1 g/dL 8.6(P)  Total Bilirubin 0.3 - 1.2 mg/dL 0.4  Alkaline Phos 38 - 126 U/L 135(H)  AST 15 - 41 U/L 18  ALT 0 - 44 U/L 15   Edinburgh Postnatal Depression Scale Screening Tool 05/27/2021  I have been able to laugh and see the funny side of things. 0  I have looked forward with enjoyment to things. 0  I have blamed myself unnecessarily when things went wrong. 1  I have been anxious or worried for no good reason. 2  I have felt scared or panicky for no good reason. 1  Things have been getting on top of me. 1  I have been so unhappy that I have had difficulty sleeping. 0  I have felt sad or miserable. 0  I have been so unhappy that I have been crying. 1  The thought of harming myself has occurred to me. 0  Edinburgh Postnatal Depression Scale Total 6   Vaccines: TDaP          UTD        COVID-19 / flu   declined  Discharge instruction:  per After Visit Summary,  [facility] booklet and   "Understanding Mother & Baby Care" hospital booklet  After Visit Meds:  Allergies as of 05/30/2021       Reactions   Nsaids Hives, Other (See Comments)   blisters        Medication List     STOP taking these medications    amphetamine-dextroamphetamine 10 MG tablet Commonly known as: Adderall   buPROPion 300 MG 24 hr tablet Commonly known as: WELLBUTRIN XL       TAKE these medications    acetaminophen 500 MG tablet Commonly known as: TYLENOL Take 2 tablets (1,000 mg total) by mouth every 6 (six) hours. What changed:  how much to take when to take this reasons to take this   coconut oil Oil Apply 1 application topically as needed.   D3-50 1.25 MG (50000 UT) capsule Generic drug: Cholecalciferol Take 1 capsule 3 x /week for Vitamin D Deficiency   EPINEPHrine 0.3 mg/0.3 mL Soaj injection Commonly known as: EPI-PEN Inject 0.3 mg into the muscle daily as needed (allergic reaction).   iron polysaccharides 150 MG capsule Commonly known as: Ferrex 150 Take 1 capsule (150 mg total) by mouth daily.   levocetirizine 5 MG tablet Commonly known as: XYZAL TAKE 1 TABLET BY MOUTH IN THE EVENING   magnesium oxide 400 (240 Mg) MG tablet Commonly known as: MAG-OX Take 1 tablet (400 mg total) by mouth daily. For prevention of constipation.   oxyCODONE 5 MG immediate release tablet Commonly known as: Oxy IR/ROXICODONE Take 1 tablet (5 mg total) by mouth every 6 (six) hours as needed for up to 5 days for moderate pain.   simethicone 80 MG chewable tablet Commonly known as: MYLICON Chew 1 tablet (80 mg total) by mouth as needed for flatulence.               Discharge Care Instructions  (From admission, onward)           Start     Ordered   05/30/21 0000  Leave dressing on - Keep it clean, dry, and intact until clinic visit        05/30/21 1101            Diet: iron rich diet  Activity: Advance as tolerated. Pelvic rest for 6 weeks.    Postpartum contraception: TBA in office  Newborn Data: Live born female  Birth Weight: 8 lb 15.2 oz (4060 g) APGAR: 8, 8  Newborn Delivery   Birth date/time: 05/27/2021  08:26:00 Delivery type: C-Section, Low Transverse Trial of labor: No C-section categorization: Primary      named [place] Baby Feeding: Breast Disposition:home with mother   Delivery Report:  Review the Delivery Report for details.    Follow up:  Follow-up Information     Amado Nash Candace Gallus, MD. Go on 06/04/2021.   Specialty: Obstetrics and Gynecology Why: For Postpartum follow-up / dressing change Contact information: [address] [place] [address] [phone]                   Signed: Neta Mends, CNM, MSN 05/30/2021, 11:03 AM

## 2021-05-30 NOTE — Discharge Instructions (Signed)
Lactation outpatient support - home visit ° ° °Jessica Bowers, IBCLC (lactation consultant)  & Birth Doula ° °Phone (text or call): [phone] °Email: [email or web address] °[email or web address] ° ° °Linda Coppola °RN, MHA, IBCLC °at [facility]: Lactation Consultant ° °[email or web address] °Mail: [email or web address] °Tel: [phone] ° ° °Additional breastfeeding resources: ° °[facility] °https://ibconline.ca/information-sheets/ ° °[employer] [facility] [place] ° °www.lllofnc.org ° ° °Other Resources: ° °Chiropractic specialist  ° °Dr. Leanna Hastings °[email or web address] ° ° °Craniosacral therapy for baby ° °Erin Balkind  °[email or web address]/ ° °

## 2021-05-30 NOTE — Lactation Note (Signed)
This note was copied from a baby's chart. Lactation Consultation Note  Patient Name: Girl Joda Braatz [id number] Date: 05/30/2021 Reason for consult: Follow-up assessment;Infant weight loss Age:29 hours  Weight stabilizing.  LC entered room at end of feeding. Nipple round after feeding.  Mother denies pain. Mother hand expressed transitional breastmilk. Discussed pumping and giving extra volume back to baby. Reviewed engorgement care and monitoring voids/stools. Discussed heart shaped tongue.  Mother aware and will discuss with Ped MD if it becomes a problem.  Feeding Mother's Current Feeding Choice: Breast Milk   Interventions Interventions: Breast feeding basics reviewed;Education  Discharge Discharge Education: Engorgement and breast care;Warning signs for feeding baby  Consult Status Consult Status: Complete Date: 05/30/21    Dahlia Byes [facility] 05/30/2021, 11:24 AM

## 2021-06-04 DIAGNOSIS — R238 Other skin changes: Secondary | ICD-10-CM | POA: Diagnosis not present

## 2021-06-04 DIAGNOSIS — F419 Anxiety disorder, unspecified: Secondary | ICD-10-CM | POA: Diagnosis not present

## 2021-06-11 ENCOUNTER — Telehealth (HOSPITAL_COMMUNITY): Payer: Self-pay

## 2021-06-11 NOTE — Telephone Encounter (Signed)
No answer. Left message to return nurse call.  Marcelino Duster [facility] 06/11/2021,1956

## 2021-06-18 DIAGNOSIS — B372 Candidiasis of skin and nail: Secondary | ICD-10-CM | POA: Diagnosis not present

## 2021-07-08 DIAGNOSIS — N760 Acute vaginitis: Secondary | ICD-10-CM | POA: Diagnosis not present

## 2021-07-08 DIAGNOSIS — N898 Other specified noninflammatory disorders of vagina: Secondary | ICD-10-CM | POA: Diagnosis not present

## 2021-07-19 NOTE — Progress Notes (Signed)
COMPLETE PHYSICAL  Assessment and Plan:  Margaret Mckinney was seen today for annual exam. Diagnoses and all orders for this visit:  Encounter for routine adult medical exam with abnormal findings Yearly  Attention deficit disorder, unspecified hyperactivity presence Has been out of medication Did well in past on Vyvanse but was no longer covered on insurance Would like to try Adderall again once she stops breast feeding  Former Smoker  Quit 05/28/20  Anxiety  Wellbutrin 150mg  XL doing well Has tried Lexapro and trintellix in the past. Discussed stress management techniques   Discussed good sleep hygiene Discussed increasing physical activity and exercise Increase water intake  Medication management Continued  Anemia, unspecified type Not taking supplementation at this time -    CBC  Screening for thyroid disorder -     TSH  Screening for diabetes mellitus -     Hemoglobin A1c  Screening for lipid disorders -     Lipid panel  Vitamin D deficiency Continue supplementation to maintain goal of 70-100 -     VITAMIN D 25 Hydroxy (Vit-D Deficiency, Fractures)  Screening for blood or protein in urine -     Urinalysis w microscopic - Micoralbumin/creatinine urine ratio  Morbid obesity (HCC) BMI 40.0-44.9, adult ([place]) Discussed dietary and exercise modifications - follow up 3 months for progress monitoring - increase veggies, decrease carbs - long discussion about weight loss, diet, and exercise    Atypical squamous cells of undetermined significance on cytologic smear of cervix (ASC-US) Continues to follow with GYN   Discussed med's effects and SE's. Screening labs and tests as requested with regular follow-up as recommended. Over 40 minutes of face to face interview, exam, counseling, chart review, and complex, high level critical decision making was performed this visit.   HPI  30 y.o. female  presents for a complete physical.   She reports overall she is doing  well. She is having occasional dizzy spells. She is on a multivitamin but is not taking Vit D or Vit 123456 , uncertain if she can take while breastfeeding.  She had a cesarean section delivery 05/27/21, she is 8 weeks postpartum and is currently nursing. She had a baby girl. She does notice she is irritable.   She has history of ADHD and has used Vyvanse in the past with positive results.  Has Tried Concerta and Adderall XR without positive results with her concentration.  Her blood pressure has been controlled at home, today their BP is BP: 122/74 BP Readings from Last 3 Encounters:  07/23/21 122/74  05/30/21 124/84  08/08/20 126/84    She does not workout. She denies chest pain, shortness of breath, dizziness.   Weight management Establish with OB/GYN related to complex history , she is currently on Slynd (drosperinone ) 4mg .  BMI is Body mass index is 43.91 kg/m., she is working on diet and exercise. Wt Readings from Last 3 Encounters:  07/23/21 255 lb 12.8 oz (116 kg)  05/27/21 275 lb (124.7 kg)  05/21/21 275 lb (124.7 kg)   Has history of urticaria, has seen rheumatoid, weak positive ANA with RNP +, otherwise very throuogh work up, did trial of plaquenil without issues.. She has not had any hives since moving in with her mom and leaving her BF, less stress.  She has a history of anxiety. Doing well with wellbutrin 300 mg daily Dating someone now- has known x 15  Years.   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 Has seen [facility] in the past.  Normal Pap during pregnancy. 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   Current  Medications:  Current Outpatient Medications on File Prior to Visit  Medication Sig Dispense Refill   acetaminophen (TYLENOL) 500 MG tablet Take 2 tablets (1,000 mg total) by mouth every 6 (six) hours. 30 tablet 0   buPROPion (WELLBUTRIN XL) 300 MG 24 hr tablet Take 300 mg by mouth daily.     Drospirenone (SLYND) 4 MG TABS Take by mouth.     EPINEPHrine 0.3 mg/0.3 mL IJ SOAJ injection Inject 0.3 mg into the muscle daily as needed (allergic reaction).     Current Facility-Administered Medications on File Prior to Visit  Medication Dose Route Frequency Provider Last Rate Last Admin   sodium citrate-citric acid (ORACIT) solution 30 mL  30 mL Oral 30 min Pre-Op Murrell Redden Earlyne Iba, MD       Allergies:  Allergies  Allergen Reactions   Nsaids Hives and Other (See Comments)    blisters   Medical History:  She has Vitamin D deficiency; Tobacco use; BMI 37.0-37.9, adult; Abnormal Pap smear of cervix; Attention deficit disorder; Anemia - IDA w/ ABL; Cesarean delivery indicated due to breech presentation; Postpartum care following cesarean delivery 10/31; and Hx of anxiety disorder on their problem list.   Health Maintenance:   Immunization History  Administered Date(s) Administered   Influenza Inj Mdck Quad With Preservative 07/08/2017    Tetanus: Sept 2017 Pneumovax: N/A Prevnar 13: N/A Flu vaccine: needs this year Zostavax: N/A   No LMP recorded. Pap: 2019 at GYN + HPV, ASCUS- needs new GYN, has had LEEP before MGM:At age 30, WNL  DEXA: N/A Colonoscopy: N/A EGD: N/A  Patient Care Team: Unk Pinto, MD as PCP - General (Internal Medicine)  Surgical History:  She has a past surgical history that includes Knee surgery (Left, 2006); Hand surgery (Right, 2019); LEEP (07/28/2017); and Cesarean section (N/A, 05/27/2021).   Family History:  Herfamily history includes Alopecia in her brother; Healthy in her father and mother; Rheum arthritis in her maternal aunt and maternal  grandmother.   Social History:  She reports that she quit smoking about 13 months ago. Her smoking use included cigarettes. She has never used smokeless tobacco. She reports current alcohol use. She reports that she does not use drugs. She is smoking socially now, does not need it due to wellbutrin.   Review of Systems: Review of Systems  Constitutional: Negative.  Negative for chills and fever.  HENT: Negative.  Negative for congestion, hearing loss, sinus pain, sore throat and tinnitus.   Eyes: Negative.  Negative for blurred vision and double vision.  Respiratory: Negative.  Negative for cough, hemoptysis, sputum production, shortness of breath and wheezing.   Cardiovascular:  Negative for chest pain, palpitations, orthopnea, claudication, leg swelling and PND.  Gastrointestinal: Negative.  Negative for abdominal pain, constipation, diarrhea, heartburn, nausea and vomiting.  Genitourinary: Negative.  Negative for dysuria and urgency.  Musculoskeletal:  Negative for back pain, falls, joint pain, myalgias and neck pain.  Skin:  Negative for itching and rash.  Neurological: Negative.  Negative for dizziness, tingling, tremors, weakness and headaches.  Endo/Heme/Allergies:  Does not bruise/bleed easily.  Psychiatric/Behavioral:  Negative for depression, hallucinations, memory loss, substance abuse and suicidal ideas. The patient is not nervous/anxious and does not have insomnia.        ADD symptoms   Physical Exam: Estimated body mass index is 43.91 kg/m as calculated from the following:   Height as of this encounter: 5\' 4"  (1.626 m).   Weight as of this encounter: 255 lb 12.8 oz (116 kg). BP 122/74    Pulse 92    Temp (!) 97.3 F (36.3 C)    Ht 5\' 4"  (1.626 m)    Wt 255 lb 12.8 oz (116 kg)    SpO2 97%    BMI 43.91 kg/m  General Appearance: Well nourished, in no apparent distress.  Eyes: PERRLA, EOMs, conjunctiva no swelling or erythema, normal fundi and vessels.  Sinuses: No  Frontal/maxillary tenderness  ENT/Mouth: Ext aud canals clear, normal light reflex with TMs without erythema, bulging. Good dentition. No erythema, swelling, or exudate on post pharynx. Tonsils not swollen or erythematous. Hearing normal.  Neck: Supple, thyroid fullness but no nodules. No bruits  Respiratory: Respiratory effort normal, BS equal bilaterally without rales, rhonchi, wheezing or stridor.  Cardio: RRR without murmurs, rubs or gallops. Brisk peripheral pulses without edema.  Chest: symmetric, with normal excursions and percussion.  Breasts: defer Abdomen: Soft, nontender, no guarding, rebound, hernias, masses, or organomegaly.  Lymphatics: Non tender without lymphadenopathy.  Genitourinary: defer Musculoskeletal: Full ROM all peripheral extremities,5/5 strength, and normal gait.  Skin: Warm, dry without rashes, lesions, ecchymosis. Neuro: Cranial nerves intact, reflexes equal bilaterally. Normal muscle tone, no cerebellar symptoms. Sensation intact.  Psych: Awake and oriented X 3, normal affect, Insight and Judgment appropriate.   EKG: DEFER today   Tajee Savant , NP 3:27 PM [facility]

## 2021-07-23 ENCOUNTER — Other Ambulatory Visit: Payer: Self-pay

## 2021-07-23 ENCOUNTER — Encounter: Payer: Self-pay | Admitting: Nurse Practitioner

## 2021-07-23 ENCOUNTER — Ambulatory Visit (INDEPENDENT_AMBULATORY_CARE_PROVIDER_SITE_OTHER): Payer: BC Managed Care – PPO | Admitting: Nurse Practitioner

## 2021-07-23 VITALS — BP 122/74 | HR 92 | Temp 97.3°F | Ht 64.0 in | Wt 255.8 lb

## 2021-07-23 DIAGNOSIS — E559 Vitamin D deficiency, unspecified: Secondary | ICD-10-CM | POA: Diagnosis not present

## 2021-07-23 DIAGNOSIS — Z79899 Other long term (current) drug therapy: Secondary | ICD-10-CM

## 2021-07-23 DIAGNOSIS — Z13220 Encounter for screening for lipoid disorders: Secondary | ICD-10-CM

## 2021-07-23 DIAGNOSIS — Z1389 Encounter for screening for other disorder: Secondary | ICD-10-CM

## 2021-07-23 DIAGNOSIS — Z131 Encounter for screening for diabetes mellitus: Secondary | ICD-10-CM | POA: Diagnosis not present

## 2021-07-23 DIAGNOSIS — D649 Anemia, unspecified: Secondary | ICD-10-CM

## 2021-07-23 DIAGNOSIS — Z1329 Encounter for screening for other suspected endocrine disorder: Secondary | ICD-10-CM

## 2021-07-23 DIAGNOSIS — E538 Deficiency of other specified B group vitamins: Secondary | ICD-10-CM

## 2021-07-23 DIAGNOSIS — Z0000 Encounter for general adult medical examination without abnormal findings: Secondary | ICD-10-CM | POA: Diagnosis not present

## 2021-07-23 DIAGNOSIS — Z130 Encounter for screening for diseases of the blood and blood-forming organs and certain disorders involving the immune mechanism: Secondary | ICD-10-CM

## 2021-07-23 DIAGNOSIS — Z87891 Personal history of nicotine dependence: Secondary | ICD-10-CM

## 2021-07-23 DIAGNOSIS — F419 Anxiety disorder, unspecified: Secondary | ICD-10-CM

## 2021-07-23 DIAGNOSIS — F988 Other specified behavioral and emotional disorders with onset usually occurring in childhood and adolescence: Secondary | ICD-10-CM

## 2021-07-23 DIAGNOSIS — Z0001 Encounter for general adult medical examination with abnormal findings: Secondary | ICD-10-CM

## 2021-07-24 LAB — URINALYSIS, ROUTINE W REFLEX MICROSCOPIC
Bilirubin Urine: NEGATIVE
Glucose, UA: NEGATIVE
Hgb urine dipstick: NEGATIVE
Ketones, ur: NEGATIVE
Leukocytes,Ua: NEGATIVE
Nitrite: NEGATIVE
Protein, ur: NEGATIVE
Specific Gravity, Urine: 1.028 (ref 1.001–1.035)
pH: 5.5 (ref 5.0–8.0)

## 2021-07-24 LAB — MAGNESIUM: Magnesium: 1.8 mg/dL (ref 1.5–2.5)

## 2021-07-24 LAB — CBC WITH DIFFERENTIAL/PLATELET
Absolute Monocytes: 484 cells/uL (ref 200–950)
Basophils Absolute: 41 cells/uL (ref 0–200)
Basophils Relative: 0.7 %
Eosinophils Absolute: 271 cells/uL (ref 15–500)
Eosinophils Relative: 4.6 %
HCT: 36.5 % (ref 35.0–45.0)
Hemoglobin: 11.8 g/dL (ref 11.7–15.5)
Lymphs Abs: 826 cells/uL — ABNORMAL LOW (ref 850–3900)
MCH: 26.8 pg — ABNORMAL LOW (ref 27.0–33.0)
MCHC: 32.3 g/dL (ref 32.0–36.0)
MCV: 83 fL (ref 80.0–100.0)
MPV: 11.1 fL (ref 7.5–12.5)
Monocytes Relative: 8.2 %
Neutro Abs: 4278 cells/uL (ref 1500–7800)
Neutrophils Relative %: 72.5 %
Platelets: 346 10*3/uL (ref 140–400)
RBC: 4.4 10*6/uL (ref 3.80–5.10)
RDW: 14.1 % (ref 11.0–15.0)
Total Lymphocyte: 14 %
WBC: 5.9 10*3/uL (ref 3.8–10.8)

## 2021-07-24 LAB — COMPLETE METABOLIC PANEL WITH GFR
AG Ratio: 1.4 (calc) (ref 1.0–2.5)
ALT: 13 U/L (ref 6–29)
AST: 14 U/L (ref 10–30)
Albumin: 4.3 g/dL (ref 3.6–5.1)
Alkaline phosphatase (APISO): 108 U/L (ref 31–125)
BUN: 16 mg/dL (ref 7–25)
CO2: 26 mmol/L (ref 20–32)
Calcium: 9.2 mg/dL (ref 8.6–10.2)
Chloride: 103 mmol/L (ref 98–110)
Creat: 0.76 mg/dL (ref 0.50–0.96)
Globulin: 3 g/dL (calc) (ref 1.9–3.7)
Glucose, Bld: 92 mg/dL (ref 65–99)
Potassium: 4.3 mmol/L (ref 3.5–5.3)
Sodium: 138 mmol/L (ref 135–146)
Total Bilirubin: 0.3 mg/dL (ref 0.2–1.2)
Total Protein: 7.3 g/dL (ref 6.1–8.1)
eGFR: 109 mL/min/{1.73_m2} (ref >=60)

## 2021-07-24 LAB — MICROALBUMIN / CREATININE URINE RATIO
Creatinine, Urine: 178 mg/dL (ref 20–275)
Microalb Creat Ratio: 4 mcg/mg creat (ref <30)
Microalb, Ur: 0.8 mg/dL

## 2021-07-24 LAB — LIPID PANEL
Cholesterol: 177 mg/dL (ref <200)
HDL: 89 mg/dL (ref >=50)
LDL Cholesterol (Calc): 66 mg/dL (calc)
Non-HDL Cholesterol (Calc): 88 mg/dL (calc) (ref <130)
Total CHOL/HDL Ratio: 2 (calc) (ref <5.0)
Triglycerides: 132 mg/dL (ref <150)

## 2021-07-24 LAB — HEMOGLOBIN A1C
Hgb A1c MFr Bld: 5.2 % of total Hgb (ref <5.7)
Mean Plasma Glucose: 103 mg/dL
eAG (mmol/L): 5.7 mmol/L

## 2021-07-24 LAB — VITAMIN D 25 HYDROXY (VIT D DEFICIENCY, FRACTURES): Vit D, 25-Hydroxy: 26 ng/mL — ABNORMAL LOW (ref 30–100)

## 2021-07-24 LAB — TSH: TSH: 0.62 mIU/L

## 2021-07-24 LAB — VITAMIN B12: Vitamin B-12: 343 pg/mL (ref 200–1100)

## 2021-08-12 ENCOUNTER — Encounter: Payer: Self-pay | Admitting: Nurse Practitioner

## 2021-08-12 ENCOUNTER — Encounter: Payer: BLUE CROSS/BLUE SHIELD | Admitting: Adult Health Nurse Practitioner

## 2021-08-12 ENCOUNTER — Encounter: Payer: BLUE CROSS/BLUE SHIELD | Admitting: Nurse Practitioner

## 2021-08-13 ENCOUNTER — Other Ambulatory Visit: Payer: Self-pay | Admitting: Nurse Practitioner

## 2021-08-13 DIAGNOSIS — F988 Other specified behavioral and emotional disorders with onset usually occurring in childhood and adolescence: Secondary | ICD-10-CM

## 2021-08-13 MED ORDER — LISDEXAMFETAMINE DIMESYLATE 60 MG PO CAPS: 60.0000 mg | ORAL_CAPSULE | Freq: Every day | ORAL | 0 refills | Status: DC

## 2021-08-29 ENCOUNTER — Encounter: Payer: Self-pay | Admitting: Nurse Practitioner

## 2021-09-03 NOTE — Progress Notes (Addendum)
Assessment and Plan: Margaret Mckinney was seen today for obesity.  Diagnoses and all orders for this visit:  BMI 40.0-44.9, adult (HCC) -Protein 100 grams a day 30 breakfast, 30 lunch, 30 dinner -Net Carbs= Total carbs- fiber- sugar alcohols, < 50 net carbs a day -Limit white bread, white rice, corn, potatoes, sugar, alcohol, bananas, watermelon -Make sure you are keeping heart rate in 120-140 for fat burning exercise.  Exercise at least 4 days a week for 30 minutes -Do not go longer than 6 hours without eating -Aiming for 64  ounces of water a day - Follow up in 3 months Wegovy sample given 0.25 mg/week -     Semaglutide-Weight Management (WEGOVY) 0.5 MG/0.5ML SOAJ; Inject 0.5 mg into the skin once a week. Addend: [name] was not covered on insurance.  Will try Metformin 500 mg BID  Encounter for initial prescription of other contraceptives -     norethindrone-ethinyl estradiol-FE (LOESTRIN FE) 1-20 MG-MCG tablet; Take 1 tablet by mouth daily.       Further disposition pending results of labs. Discussed med's effects and SE's.   Over 30 minutes of exam, counseling, chart review, and critical decision making was performed.   Future Appointments  Date Time Provider Department Center  07/23/2022  3:00 PM Revonda Humphrey, NP [facility] None    ------------------------------------------------------------------------------------------------------------------   HPI BP 126/80    Pulse 87    Temp (!) 97.2 F (36.2 C)    Wt 262 lb (118.8 kg)    SpO2 99%    Breastfeeding No    BMI 44.97 kg/m   31 y.o.female presents for weight gain with pregnancy, now 4 months postpartum and has been unable to lose weight with normal exercise and diet which did work previously. Breast fed 10 weeks and has stopped.  BMI is Body mass index is 44.97 kg/m., she has been working on diet and exercise. She is walking , goes to gym 4 days a week, does cardio and light weights. She does occasionally skip meals, not  eating breakfast. Wt Readings from Last 3 Encounters:  09/04/21 262 lb (118.8 kg)  07/23/21 255 lb 12.8 oz (116 kg)  05/27/21 275 lb (124.7 kg)   Is also interested in restarting regular OC since stopping breastfeeding   Past Medical History:  Diagnosis Date   ADD (attention deficit disorder)    Anxiety      Allergies  Allergen Reactions   Nsaids Hives and Other (See Comments)    blisters    Current Outpatient Medications on File Prior to Visit  Medication Sig   acetaminophen (TYLENOL) 500 MG tablet Take 2 tablets (1,000 mg total) by mouth every 6 (six) hours.   buPROPion (WELLBUTRIN XL) 300 MG 24 hr tablet Take 300 mg by mouth daily.   Drospirenone (SLYND) 4 MG TABS Take by mouth.   EPINEPHrine 0.3 mg/0.3 mL IJ SOAJ injection Inject 0.3 mg into the muscle daily as needed (allergic reaction).   lisdexamfetamine (VYVANSE) 60 MG capsule Take 1 capsule (60 mg total) by mouth daily.   Current Facility-Administered Medications on File Prior to Visit  Medication   sodium citrate-citric acid (ORACIT) solution 30 mL    ROS: all negative except above.   Physical Exam:  BP 126/80    Pulse 87    Temp (!) 97.2 F (36.2 C)    Wt 262 lb (118.8 kg)    SpO2 99%    Breastfeeding No    BMI 44.97 kg/m   General Appearance:  Very pleasant obese female, in no apparent distress. Eyes: PERRLA, EOMs, conjunctiva no swelling or erythema Sinuses: No Frontal/maxillary tenderness ENT/Mouth: Ext aud canals clear, TMs without erythema, bulging. No erythema, swelling, or exudate on post pharynx.  Tonsils not swollen or erythematous. Hearing normal.  Neck: Supple, thyroid normal.  Respiratory: Respiratory effort normal, BS equal bilaterally without rales, rhonchi, wheezing or stridor.  Cardio: RRR with no MRGs. Brisk peripheral pulses without edema.  Abdomen: Soft, + BS.  Non tender, no guarding, rebound, hernias, masses. Lymphatics: Non tender without lymphadenopathy.  Musculoskeletal: Full ROM,  5/5 strength, normal gait.  Skin: Warm, dry without rashes, lesions, ecchymosis.  Neuro: Cranial nerves intact. Normal muscle tone, no cerebellar symptoms. Sensation intact.  Psych: Awake and oriented X 3, normal affect, Insight and Judgment appropriate.     Revonda Humphrey, NP 11:38 AM [name]

## 2021-09-04 ENCOUNTER — Other Ambulatory Visit: Payer: Self-pay

## 2021-09-04 ENCOUNTER — Ambulatory Visit (INDEPENDENT_AMBULATORY_CARE_PROVIDER_SITE_OTHER): Payer: BC Managed Care – PPO | Admitting: Nurse Practitioner

## 2021-09-04 ENCOUNTER — Encounter: Payer: Self-pay | Admitting: Nurse Practitioner

## 2021-09-04 ENCOUNTER — Other Ambulatory Visit: Payer: Self-pay | Admitting: Nurse Practitioner

## 2021-09-04 VITALS — BP 126/80 | HR 87 | Temp 97.2°F | Wt 262.0 lb

## 2021-09-04 DIAGNOSIS — Z30018 Encounter for initial prescription of other contraceptives: Secondary | ICD-10-CM

## 2021-09-04 DIAGNOSIS — Z6841 Body Mass Index (BMI) 40.0 and over, adult: Secondary | ICD-10-CM | POA: Diagnosis not present

## 2021-09-04 DIAGNOSIS — R7309 Other abnormal glucose: Secondary | ICD-10-CM | POA: Diagnosis not present

## 2021-09-04 MED ORDER — WEGOVY 0.5 MG/0.5ML ~~LOC~~ SOAJ: 0.5000 mg | SUBCUTANEOUS | 2 refills | Status: DC

## 2021-09-04 MED ORDER — NORETHIN ACE-ETH ESTRAD-FE 1-20 MG-MCG PO TABS: 1.0000 | ORAL_TABLET | Freq: Every day | ORAL | 11 refills | Status: DC

## 2021-09-04 NOTE — Patient Instructions (Signed)
Protein 100 grams a day 30 breakfast, 30 lunch, 30 dinner  Net Carbs= Total carbs- fiber- sugar alcohols  Make sure you are keeping heartrate in 120-140 for fat burning  Do not go longer than 6 hours without eating  Aiming for 64  ounces of water a day  Semaglutide Injection (Weight Management) What is this medication? SEMAGLUTIDE (SEM a GLOO tide) promotes weight loss. It may also be used to maintain weight loss. It works by decreasing appetite. Changes to diet and exercise are often combined with this medication. This medicine may be used for other purposes; ask your health care provider or pharmacist if you have questions. COMMON BRAND NAME(S): INOMVE What should I tell my care team before I take this medication? They need to know if you have any of these conditions: Endocrine tumors (MEN 2) or if someone in your family had these tumors Eye disease, vision problems Gallbladder disease History of depression or mental health disease History of pancreatitis Kidney disease Stomach or intestine problems Suicidal thoughts, plans, or attempt; a previous suicide attempt by you or a family member Thyroid cancer or if someone in your family had thyroid cancer An unusual or allergic reaction to semaglutide, other medications, foods, dyes, or preservatives Pregnant or trying to get pregnant Breast-feeding How should I use this medication? This medication is injected under the skin. You will be taught how to prepare and give it. Take it as directed on the prescription label. It is given once every week (every 7 days). Keep taking it unless your care team tells you to stop. It is important that you put your used needles and pens in a special sharps container. Do not put them in a trash can. If you do not have a sharps container, call your pharmacist or care team to get one. A special MedGuide will be given to you by the pharmacist with each prescription and refill. Be sure to read this  information carefully each time. This medication comes with INSTRUCTIONS FOR USE. Ask your pharmacist for directions on how to use this medication. Read the information carefully. Talk to your pharmacist or care team if you have questions. Talk to your care team about the use of this medication in children. Special care may be needed. Overdosage: If you think you have taken too much of this medicine contact a poison control center or emergency room at once. NOTE: This medicine is only for you. Do not share this medicine with others. What if I miss a dose? If you miss a dose and the next scheduled dose is more than 2 days away, take the missed dose as soon as possible. If you miss a dose and the next scheduled dose is less than 2 days away, do not take the missed dose. Take the next dose at your regular time. Do not take double or extra doses. If you miss your dose for 2 weeks or more, take the next dose at your regular time or call your care team to talk about how to restart this medication. What may interact with this medication? Insulin and other medications for diabetes This list may not describe all possible interactions. Give your health care provider a list of all the medicines, herbs, non-prescription drugs, or dietary supplements you use. Also tell them if you smoke, drink alcohol, or use illegal drugs. Some items may interact with your medicine. What should I watch for while using this medication? Visit your care team for regular checks on your progress.  It may be some time before you see the benefit from this medication. Drink plenty of fluids while taking this medication. Check with your care team if you have severe diarrhea, nausea, and vomiting, or if you sweat a lot. The loss of too much body fluid may make it dangerous for you to take this medication. This medication may affect blood sugar levels. Ask your care team if changes in diet or medications are needed if you have diabetes. If  you or your family notice any changes in your behavior, such as new or worsening depression, thoughts of harming yourself, anxiety, other unusual or disturbing thoughts, or memory loss, call your care team right away. Women should inform their care team if they wish to become pregnant or think they might be pregnant. Losing weight while pregnant is not advised and may cause harm to the unborn child. Talk to your care team for more information. What side effects may I notice from receiving this medication? Side effects that you should report to your care team as soon as possible: Allergic reactions--skin rash, itching, hives, swelling of the face, lips, tongue, or throat Change in vision Dehydration--increased thirst, dry mouth, feeling faint or lightheaded, headache, dark yellow or brown urine Gallbladder problems--severe stomach pain, nausea, vomiting, fever Heart palpitations--rapid, pounding, or irregular heartbeat Kidney injury--decrease in the amount of urine, swelling of the ankles, hands, or feet Pancreatitis--severe stomach pain that spreads to your back or gets worse after eating or when touched, fever, nausea, vomiting Thoughts of suicide or self-harm, worsening mood, feelings of depression Thyroid cancer--new mass or lump in the neck, pain or trouble swallowing, trouble breathing, hoarseness Side effects that usually do not require medical attention (report to your care team if they continue or are bothersome): Diarrhea Loss of appetite Nausea Stomach pain Vomiting This list may not describe all possible side effects. Call your doctor for medical advice about side effects. You may report side effects to FDA at 1-800-FDA-1088. Where should I keep my medication? Keep out of the reach of children and pets. Refrigeration (preferred): Store in the refrigerator. Do not freeze. Keep this medication in the original container until you are ready to take it. Get rid of any unused medication  after the expiration date. Room temperature: If needed, prior to cap removal, the pen can be stored at room temperature for up to 28 days. Protect from light. If it is stored at room temperature, get rid of any unused medication after 28 days or after it expires, whichever is first. It is important to get rid of the medication as soon as you no longer need it or it is expired. You can do this in two ways: Take the medication to a medication take-back program. Check with your pharmacy or law enforcement to find a location. If you cannot return the medication, follow the directions in the MedGuide. NOTE: This sheet is a summary. It may not cover all possible information. If you have questions about this medicine, talk to your doctor, pharmacist, or health care provider.  2022 Elsevier/Gold Standard (2020-10-19 00:00:00)

## 2021-09-04 NOTE — Telephone Encounter (Signed)
Prior auth requested

## 2021-09-05 ENCOUNTER — Encounter: Payer: Self-pay | Admitting: Nurse Practitioner

## 2021-09-09 ENCOUNTER — Telehealth: Payer: Self-pay

## 2021-09-09 MED ORDER — METFORMIN HCL 500 MG PO TABS: 500.0000 mg | ORAL_TABLET | Freq: Two times a day (BID) | ORAL | 11 refills | Status: DC

## 2021-09-09 NOTE — Telephone Encounter (Signed)
Margaret Mckinney not covered by her insurance. Annabelle Harman started her on Metformin 500mg  BID. If she wants to start the phentermine she has to be off the vyvanse for a month before she can start it. I talked to patient and she is aware of that and will call back and let know when she has been off of the vyvanse.

## 2021-09-09 NOTE — Addendum Note (Signed)
Addended by: Revonda Humphrey on: 09/09/2021 12:34 PM   Modules accepted: Orders

## 2021-09-10 IMAGING — US US ABDOMEN LIMITED
1 series · 14 of 25 positions shown · non-contrast
Comparison: CT 01/27/2008

CLINICAL DATA: Right upper quadrant pain

EXAM:
ULTRASOUND ABDOMEN LIMITED RIGHT UPPER QUADRANT

[Series 1: us abdomen limited ruq (liver/gb) · 14 of 31 slices shown]
[im 1/31]
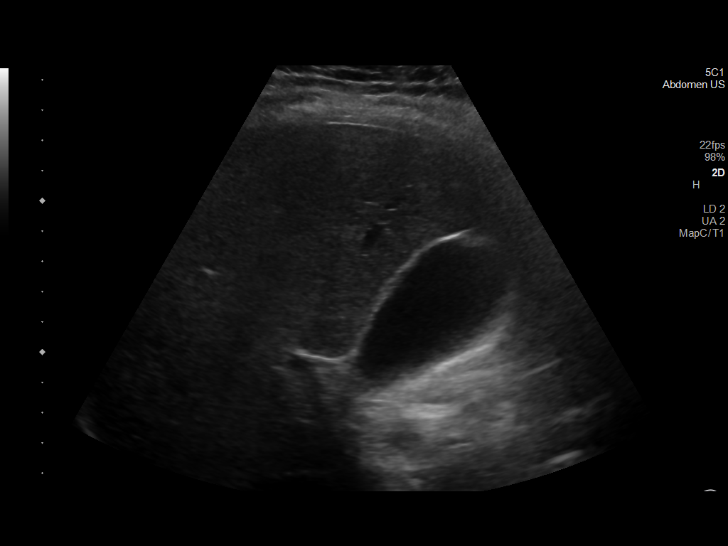
[im 3/31]
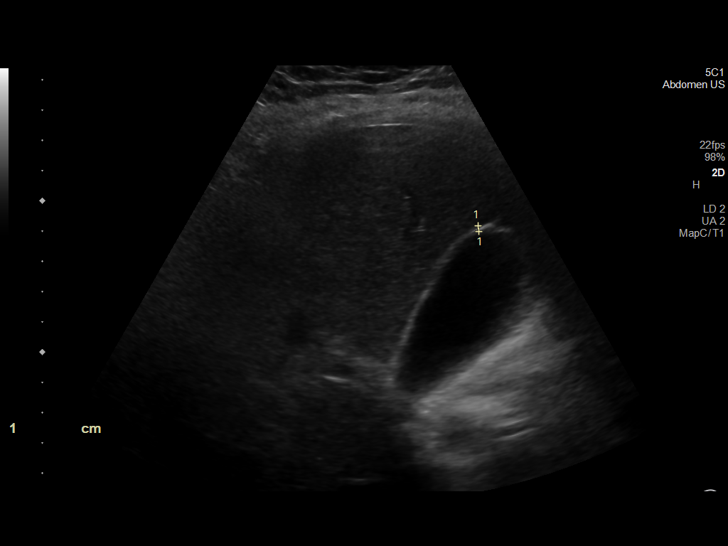
[im 6/31]
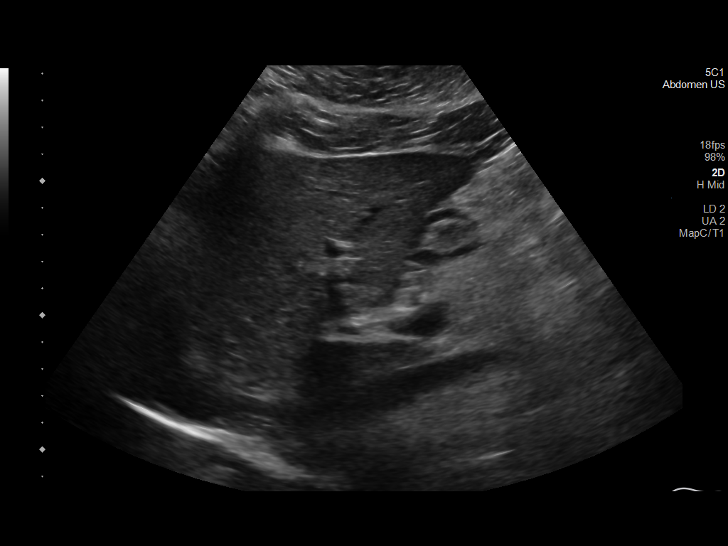
[im 8/31]
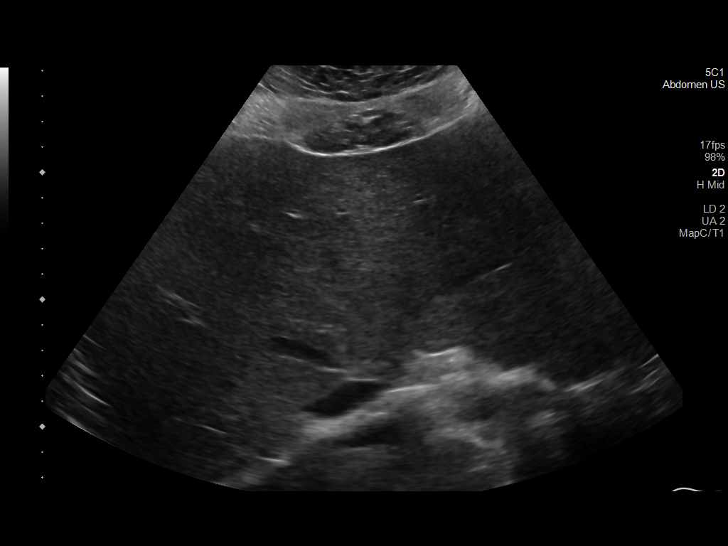
[im 11/31]
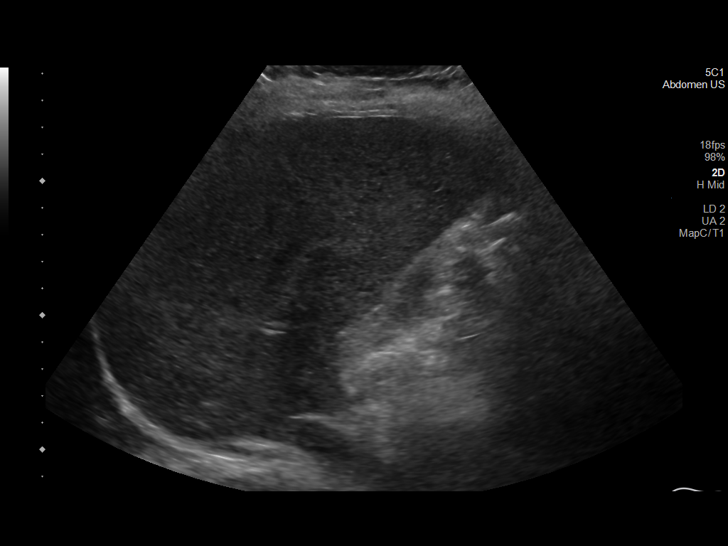
[im 12/31]
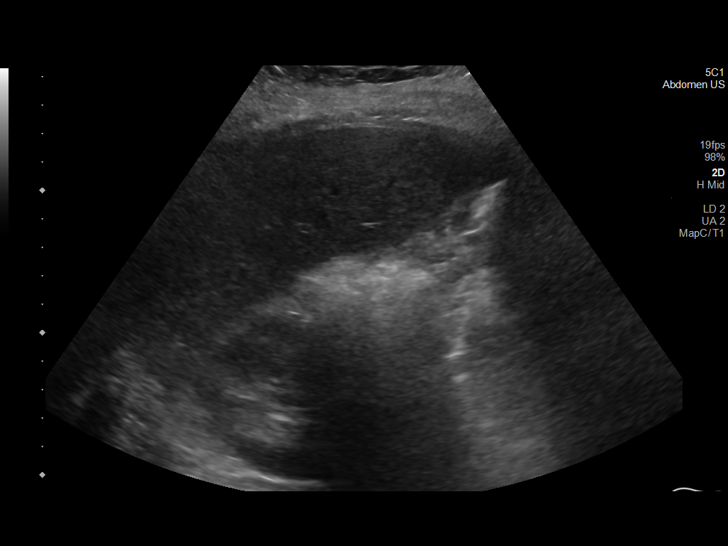
[im 14/31]
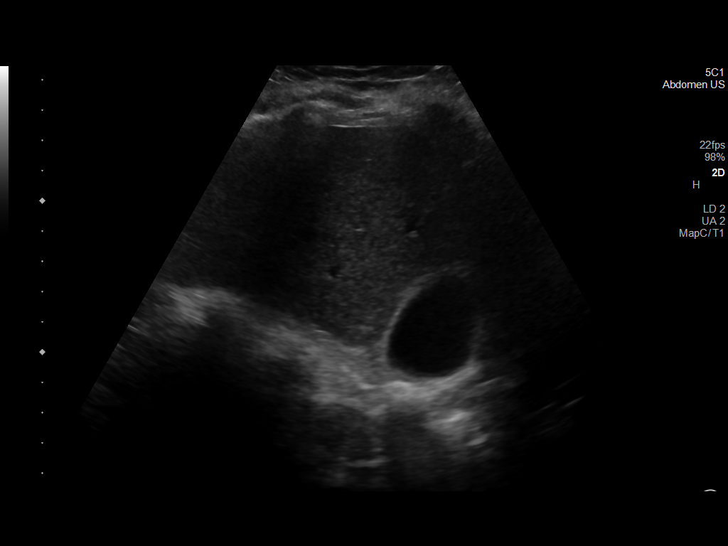
[im 17/31]
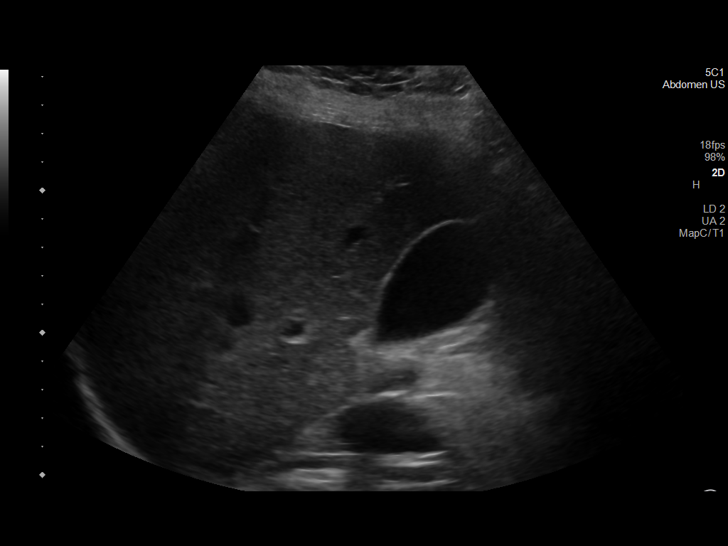
[im 19/31]
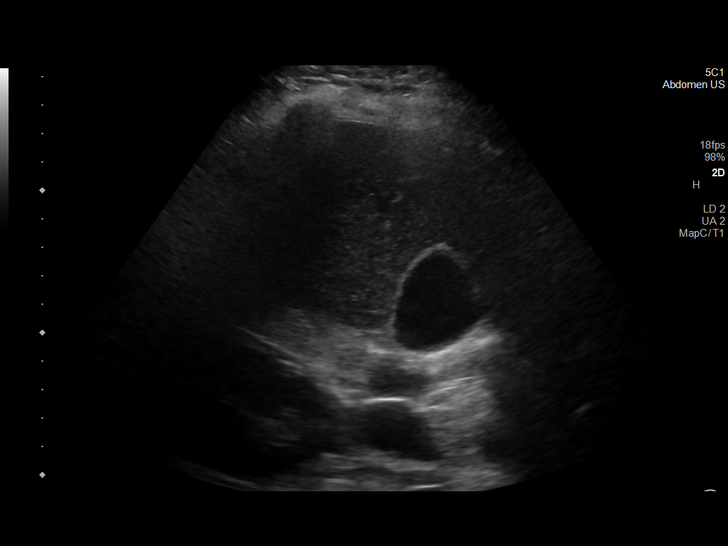
[im 21/31]
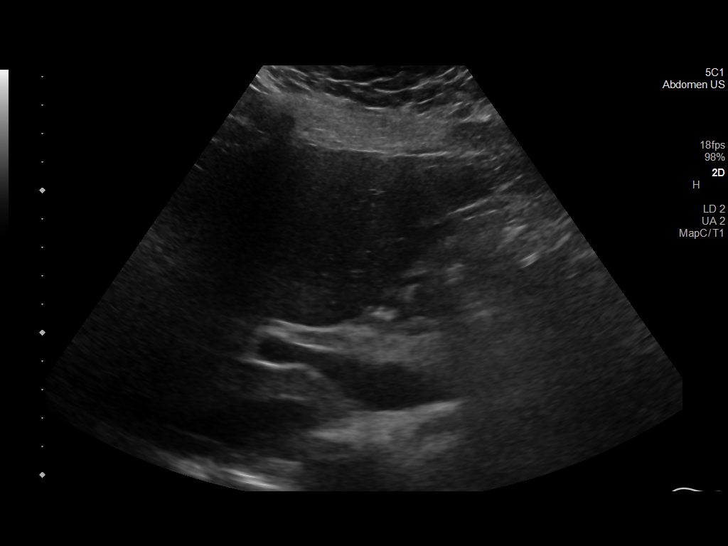
[im 23/31]
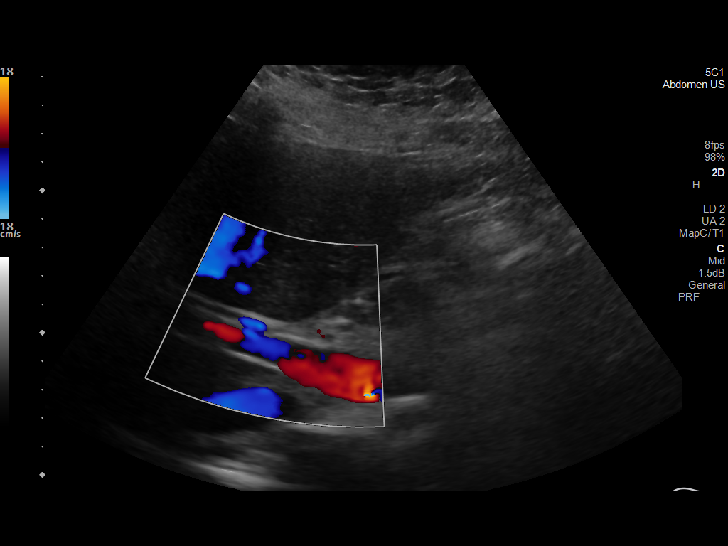
[im 26/31]
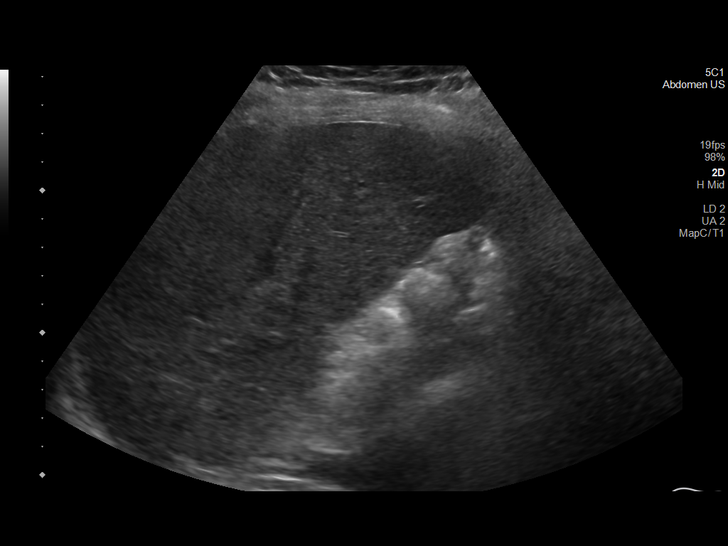
[im 28/31]
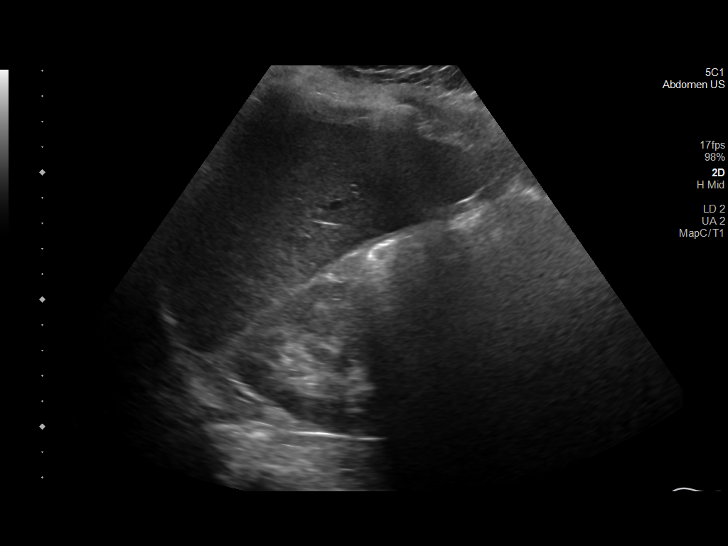
[im 31/31]
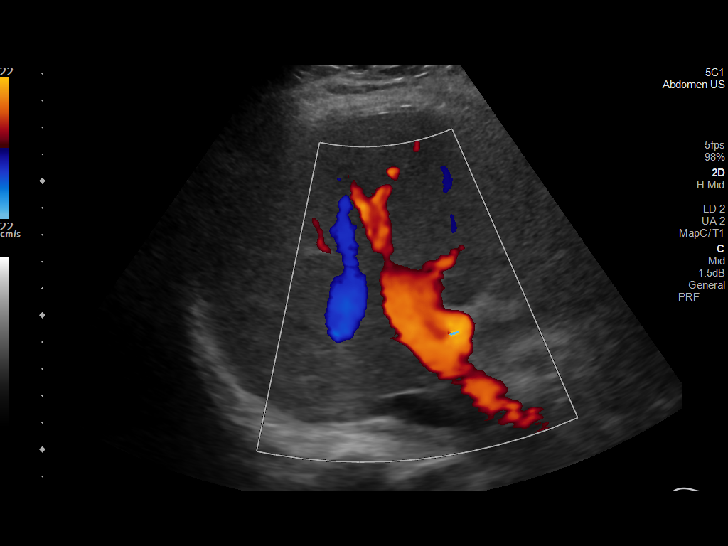

[14 of 25 positions shown; findings below may reference images not displayed]

FINDINGS: Gallbladder:

No gallstones or wall thickening visualized. No sonographic Murphy
sign noted by sonographer.

Common bile duct:

Diameter: 4 mm.

Liver:

No focal lesion identified. Within normal limits in parenchymal
echogenicity. Portal vein is patent on color Doppler imaging with
normal direction of blood flow towards the liver.

Other: None.
IMPRESSION: Unremarkable ultrasound of the right upper quadrant.

## 2021-09-19 ENCOUNTER — Other Ambulatory Visit: Payer: Self-pay | Admitting: Nurse Practitioner

## 2021-09-19 DIAGNOSIS — Z6841 Body Mass Index (BMI) 40.0 and over, adult: Secondary | ICD-10-CM

## 2021-09-19 MED ORDER — PHENTERMINE HCL 37.5 MG PO TABS: ORAL_TABLET | ORAL | 2 refills | Status: DC

## 2021-09-19 NOTE — Progress Notes (Signed)
Pt has stopped Vyvanse and is interested in starting Phentermine as discussed at her visit. Script for Phentermine 37.5mg  1 tab daily sent to pharmacy.  Will follow up in 3 months

## 2021-10-08 DIAGNOSIS — Z6841 Body Mass Index (BMI) 40.0 and over, adult: Secondary | ICD-10-CM | POA: Diagnosis not present

## 2021-10-08 DIAGNOSIS — Z01419 Encounter for gynecological examination (general) (routine) without abnormal findings: Secondary | ICD-10-CM | POA: Diagnosis not present

## 2021-10-08 DIAGNOSIS — Z309 Encounter for contraceptive management, unspecified: Secondary | ICD-10-CM | POA: Diagnosis not present

## 2021-10-15 ENCOUNTER — Encounter: Payer: Self-pay | Admitting: Nurse Practitioner

## 2021-10-21 ENCOUNTER — Telehealth: Payer: Self-pay | Admitting: Nurse Practitioner

## 2021-10-21 ENCOUNTER — Other Ambulatory Visit: Payer: Self-pay | Admitting: Nurse Practitioner

## 2021-10-21 DIAGNOSIS — Z6841 Body Mass Index (BMI) 40.0 and over, adult: Secondary | ICD-10-CM

## 2021-10-21 MED ORDER — PHENTERMINE HCL 37.5 MG PO TABS: ORAL_TABLET | ORAL | 1 refills | Status: DC

## 2021-10-21 NOTE — Telephone Encounter (Signed)
Pt is requesting Phentermine refill to go to [facility] in [place]  ?

## 2021-10-21 NOTE — Progress Notes (Signed)
PDMP checked for Phentermine refill ?

## 2021-12-02 NOTE — Progress Notes (Deleted)
Assessment and Plan: Margaret Mckinney was seen today for obesity.  Diagnoses and all orders for this visit:  BMI 40.0-44.9, adult (HCC) -Protein 100 grams a day 30 breakfast, 30 lunch, 30 dinner -Net Carbs= Total carbs- fiber- sugar alcohols, < 50 net carbs a day -Limit white bread, white rice, corn, potatoes, sugar, alcohol, bananas, watermelon -Make sure you are keeping heart rate in 120-140 for fat burning exercise.  Exercise at least 4 days a week for 30 minutes -Do not go longer than 6 hours without eating -Aiming for 64  ounces of water a day - Follow up in 3 months Wegovy sample given 0.25 mg/week -     Semaglutide-Weight Management (WEGOVY) 0.5 MG/0.5ML SOAJ; Inject 0.5 mg into the skin once a week. Addend: Margaret Mckinney was not covered on insurance.  Will try Metformin 500 mg BID         Further disposition pending results of labs. Discussed med's effects and SE's.   Over 30 minutes of exam, counseling, chart review, and critical decision making was performed.   Future Appointments  Date Time Provider Department Center  12/03/2021 11:30 AM Revonda Humphrey, NP [facility] None  07/23/2022  3:00 PM Revonda Humphrey, NP [facility] None    ------------------------------------------------------------------------------------------------------------------   HPI There were no vitals taken for this visit.  31 y.o.female presents for weight gain with pregnancy, now 4 months postpartum and has been unable to lose weight with normal exercise and diet which did work previously. Breast fed 10 weeks and has stopped.  BMI is There is no height or weight on file to calculate BMI., she has been working on diet and exercise. She is walking , goes to gym 4 days a week, does cardio and light weights. She does occasionally skip meals, not eating breakfast. Wt Readings from Last 3 Encounters:  09/04/21 262 lb (118.8 kg)  07/23/21 255 lb 12.8 oz (116 kg)  05/27/21 275 lb (124.7 kg)   Is also interested in  restarting regular OC since stopping breastfeeding   Past Medical History:  Diagnosis Date   ADD (attention deficit disorder)    Anxiety      Allergies  Allergen Reactions   Nsaids Hives and Other (See Comments)    blisters    Current Outpatient Medications on File Prior to Visit  Medication Sig   acetaminophen (TYLENOL) 500 MG tablet Take 2 tablets (1,000 mg total) by mouth every 6 (six) hours.   buPROPion (WELLBUTRIN XL) 300 MG 24 hr tablet Take 300 mg by mouth daily.   EPINEPHrine 0.3 mg/0.3 mL IJ SOAJ injection Inject 0.3 mg into the muscle daily as needed (allergic reaction).   metFORMIN (GLUCOPHAGE) 500 MG tablet Take 1 tablet (500 mg total) by mouth 2 (two) times daily with a meal.   norethindrone-ethinyl estradiol-FE (LOESTRIN FE) 1-20 MG-MCG tablet Take 1 tablet by mouth daily.   phentermine (ADIPEX-P) 37.5 MG tablet Take 1/2 to 1 tablet every morning for dieting & weightloss   WEGOVY 0.5 MG/0.5ML SOAJ INJECT 0.5 MG INTO THE SKIN ONCE A WEEK.   Current Facility-Administered Medications on File Prior to Visit  Medication   sodium citrate-citric acid (ORACIT) solution 30 mL    ROS: all negative except above.   Physical Exam:  There were no vitals taken for this visit.  General Appearance: Very pleasant obese female, in no apparent distress. Eyes: PERRLA, EOMs, conjunctiva no swelling or erythema Sinuses: No Frontal/maxillary tenderness ENT/Mouth: Ext aud canals clear, TMs without erythema, bulging. No erythema,  swelling, or exudate on post pharynx.  Tonsils not swollen or erythematous. Hearing normal.  Neck: Supple, thyroid normal.  Respiratory: Respiratory effort normal, BS equal bilaterally without rales, rhonchi, wheezing or stridor.  Cardio: RRR with no MRGs. Brisk peripheral pulses without edema.  Abdomen: Soft, + BS.  Non tender, no guarding, rebound, hernias, masses. Lymphatics: Non tender without lymphadenopathy.  Musculoskeletal: Full ROM, 5/5 strength,  normal gait.  Skin: Warm, dry without rashes, lesions, ecchymosis.  Neuro: Cranial nerves intact. Normal muscle tone, no cerebellar symptoms. Sensation intact.  Psych: Awake and oriented X 3, normal affect, Insight and Judgment appropriate.     Revonda Humphrey, NP 11:06 AM [facility]

## 2021-12-03 ENCOUNTER — Ambulatory Visit: Payer: BC Managed Care – PPO | Admitting: Nurse Practitioner

## 2021-12-03 DIAGNOSIS — F988 Other specified behavioral and emotional disorders with onset usually occurring in childhood and adolescence: Secondary | ICD-10-CM

## 2021-12-03 DIAGNOSIS — F419 Anxiety disorder, unspecified: Secondary | ICD-10-CM

## 2021-12-03 DIAGNOSIS — Z6841 Body Mass Index (BMI) 40.0 and over, adult: Secondary | ICD-10-CM

## 2021-12-03 DIAGNOSIS — Z79899 Other long term (current) drug therapy: Secondary | ICD-10-CM

## 2021-12-03 DIAGNOSIS — R7309 Other abnormal glucose: Secondary | ICD-10-CM

## 2021-12-04 ENCOUNTER — Encounter: Payer: Self-pay | Admitting: Nurse Practitioner

## 2021-12-04 ENCOUNTER — Ambulatory Visit: Payer: BC Managed Care – PPO | Admitting: Nurse Practitioner

## 2021-12-04 ENCOUNTER — Other Ambulatory Visit: Payer: Self-pay | Admitting: Nurse Practitioner

## 2021-12-04 DIAGNOSIS — F411 Generalized anxiety disorder: Secondary | ICD-10-CM | POA: Diagnosis not present

## 2021-12-04 DIAGNOSIS — F321 Major depressive disorder, single episode, moderate: Secondary | ICD-10-CM | POA: Diagnosis not present

## 2021-12-04 DIAGNOSIS — F988 Other specified behavioral and emotional disorders with onset usually occurring in childhood and adolescence: Secondary | ICD-10-CM

## 2021-12-04 DIAGNOSIS — Z79899 Other long term (current) drug therapy: Secondary | ICD-10-CM

## 2021-12-04 MED ORDER — LISDEXAMFETAMINE DIMESYLATE 60 MG PO CAPS: 60.0000 mg | ORAL_CAPSULE | Freq: Every day | ORAL | 0 refills | Status: DC

## 2021-12-04 MED ORDER — FLUOXETINE HCL 10 MG PO CAPS
10.0000 mg | ORAL_CAPSULE | Freq: Every day | ORAL | 2 refills | Status: DC
Start: 2021-12-04 — End: 2022-01-09

## 2021-12-04 NOTE — Progress Notes (Signed)
Assessment and Plan: ? ?Margaret Mckinney was seen today for a follow up. ? ?Diagnoses and all order for this visit: ? ?1. Morbid obesity (HCC) ?Continue lifestyle modifications.  ?Patient to reach out to [facility] for review of weight loss coverage ? ?2. Generalized anxiety disorder ?GAD 7 completed, discussed and reviewed. ?Continue Wellbutrin ?Start Prozac ?Notify office for any extreme, uncontrolled mood swings. ?Report to ER or call 911 for any increase in HI/SI. ? ? ?- FLUoxetine (PROZAC) 10 MG capsule; Take 1 capsule (10 mg total) by mouth daily.  Dispense: 30 capsule; Refill: 2 ? ?3. Episode of moderate major depression (HCC) ?PHQ - 9 completed, discussed and reviewed. ?Continue Wellbutrin ?Start Prozac ?Notify office for any extreme, uncontrolled mood swings. ?Report to ER or call 911 for any increase in HI/SI. ? ?- FLUoxetine (PROZAC) 10 MG capsule; Take 1 capsule (10 mg total) by mouth daily.  Dispense: 30 capsule; Refill: 2 ? ?4. Attention deficit disorder, unspecified hyperactivity presence ?No longer taking Phentermine ?PMPD reviewed ?Continue Vyvanse as directed. ? ?- lisdexamfetamine (VYVANSE) 60 MG capsule; Take 1 capsule (60 mg total) by mouth daily.  Dispense: 30 capsule; Refill: 0 ? ?5. Medication management ?All medications reviewed and discussed ?All questions and concerns addressed.  ? ?Notify office for further evaluation and treatment, questions or concerns if s/s fail to improve. The risks and benefits of my recommendations, as well as other treatment options were discussed with the patient today. Questions were answered. ? ?Further disposition pending results of labs. Discussed med's effects and SE's.   ? ?Over 15 minutes of exam, counseling, chart review, and critical decision making was performed.  ? ?Future Appointments  ?Date Time Provider Department Center  ?01/09/2022  9:30 AM Adela Glimpse, NP [facility] None  ?07/23/2022  3:00 PM Revonda Humphrey, NP [facility] None   ? ? ?------------------------------------------------------------------------------------------------------------------ ? ? ?HPI ?BP 122/80   Pulse 91   Temp (!) 97.5 ?F (36.4 ?C)   Wt 258 lb 9.6 oz (117.3 kg)   LMP 12/04/2021   SpO2 99%   BMI 44.39 kg/m?  ? ?31 y.o.female presents for follow up on weight loss. ? ?She has been unable to obtain Wegovy due to insurance denial.  She has been taking Metformin and Adipex.  Reports medication SE and unable to tolerate.  She has lost 4 lb since last OV 08/2021.  She is working on lifestyle modifications by monitoring dietary intake and increase in activity.   ? ?She is a new mom.  Daughter is 74 mo old.  She is feeling more overwhelmed in her job.  States that life is more demanding. Has more anxiety about caring for her daughter.  Has support system with husband. She has been on Wellbutrin 300 mg for "years."  She does not want to stop the Wellbutrin.  Denies extreme mood swings, HI/SI.  ? ?Requesting refill on Vyvanse for tmt of ADHD.  She no longer want to take Adipex.  ? ? ?Past Medical History:  ?Diagnosis Date  ? ADD (attention deficit disorder)   ? Anxiety   ?  ? ?Allergies  ?Allergen Reactions  ? Nsaids Hives and Other (See Comments)  ?  blisters  ? ? ?Current Outpatient Medications on File Prior to Visit  ?Medication Sig  ? acetaminophen (TYLENOL) 500 MG tablet Take 2 tablets (1,000 mg total) by mouth every 6 (six) hours.  ? buPROPion (WELLBUTRIN XL) 300 MG 24 hr tablet Take 300 mg by mouth daily.  ? EPINEPHrine 0.3  mg/0.3 mL IJ SOAJ injection Inject 0.3 mg into the muscle daily as needed (allergic reaction).  ? metFORMIN (GLUCOPHAGE) 500 MG tablet Take 1 tablet (500 mg total) by mouth 2 (two) times daily with a meal.  ? norethindrone-ethinyl estradiol-FE (LOESTRIN FE) 1-20 MG-MCG tablet Take 1 tablet by mouth daily.  ? phentermine (ADIPEX-P) 37.5 MG tablet Take 1/2 to 1 tablet every morning for dieting & weightloss  ? WEGOVY 0.5 MG/0.5ML SOAJ INJECT 0.5 MG  INTO THE SKIN ONCE A WEEK. (Patient not taking: Reported on 12/04/2021)  ? ?Current Facility-Administered Medications on File Prior to Visit  ?Medication  ? sodium citrate-citric acid (ORACIT) solution 30 mL  ? ? ?ROS: all negative except what is noted in the HPI.  ? ?Physical Exam: ? ?BP 122/80   Pulse 91   Temp (!) 97.5 ?F (36.4 ?C)   Wt 258 lb 9.6 oz (117.3 kg)   LMP 12/04/2021   SpO2 99%   BMI 44.39 kg/m?  ? ?General Appearance: NAD.  Awake, conversant and cooperative. ?Eyes: PERRLA, EOMs intact.  Sclera white.  Conjunctiva without erythema. ?Sinuses: No frontal/maxillary tenderness.  No nasal discharge. Nares patent.  ?ENT/Mouth: Ext aud canals clear.  Bilateral TMs w/DOL and without erythema or bulging. Hearing intact.  Posterior pharynx without swelling or exudate.  Tonsils without swelling or erythema.  ?Neck: Supple.  No masses, nodules or thyromegaly. ?Respiratory: Effort is regular with non-labored breathing. Breath sounds are equal bilaterally without rales, rhonchi, wheezing or stridor.  ?Cardio: RRR with no MRGs. Brisk peripheral pulses without edema.  ?Abdomen: Active BS in all four quadrants.  Soft and non-tender without guarding, rebound tenderness, hernias or masses. ?Lymphatics: Non tender without lymphadenopathy.  ?Musculoskeletal: Full ROM, 5/5 strength, normal ambulation.  No clubbing or cyanosis. ?Skin: Appropriate color for ethnicity. Warm without rashes, lesions, ecchymosis, ulcers.  ?Neuro: CN II-XII grossly normal. Normal muscle tone without cerebellar symptoms and intact sensation.   ?Psych: AO X 3,  appropriate mood and affect, insight and judgment.  ? ? ?  12/04/2021  ?  4:14 PM  ?Depression screen PHQ 2/9  ?Decreased Interest 1  ?Down, Depressed, Hopeless 2  ?PHQ - 2 Score 3  ?Altered sleeping 0  ?Tired, decreased energy 3  ?Change in appetite 3  ?Feeling bad or failure about yourself  2  ?Trouble concentrating 3  ?Moving slowly or fidgety/restless 0  ?Suicidal thoughts 0  ?PHQ-9  Score 14  ?Difficult doing work/chores Somewhat difficult  ?  ? ?  12/04/2021  ?  4:17 PM  ?GAD 7 : Generalized Anxiety Score  ?Nervous, Anxious, on Edge 3  ?Control/stop worrying 3  ?Worry too much - different things 3  ?Trouble relaxing 2  ?Restless 0  ?Easily annoyed or irritable 1  ?Afraid - awful might happen 2  ?Total GAD 7 Score 14  ?Anxiety Difficulty Somewhat difficult  ? ?  ? ?Adela Glimpse, NP ?10:28 PM ?[facility] ? ?

## 2021-12-05 ENCOUNTER — Other Ambulatory Visit: Payer: Self-pay | Admitting: Nurse Practitioner

## 2021-12-05 DIAGNOSIS — R7301 Impaired fasting glucose: Secondary | ICD-10-CM

## 2021-12-05 MED ORDER — OZEMPIC (0.25 OR 0.5 MG/DOSE) 2 MG/3ML ~~LOC~~ SOPN: 0.5000 mg | PEN_INJECTOR | SUBCUTANEOUS | 2 refills | Status: DC

## 2021-12-10 ENCOUNTER — Telehealth: Payer: Self-pay

## 2021-12-10 NOTE — Telephone Encounter (Signed)
Prior auth for Ozempic denied.  

## 2021-12-17 ENCOUNTER — Telehealth: Payer: Self-pay

## 2021-12-17 NOTE — Telephone Encounter (Signed)
[facility] Prior Authorization is denied

## 2021-12-24 DIAGNOSIS — R42 Dizziness and giddiness: Secondary | ICD-10-CM | POA: Diagnosis not present

## 2021-12-24 DIAGNOSIS — R1110 Vomiting, unspecified: Secondary | ICD-10-CM | POA: Diagnosis not present

## 2021-12-24 DIAGNOSIS — R112 Nausea with vomiting, unspecified: Secondary | ICD-10-CM | POA: Diagnosis not present

## 2021-12-24 DIAGNOSIS — R197 Diarrhea, unspecified: Secondary | ICD-10-CM | POA: Diagnosis not present

## 2021-12-31 ENCOUNTER — Other Ambulatory Visit: Payer: Self-pay | Admitting: Nurse Practitioner

## 2021-12-31 DIAGNOSIS — F988 Other specified behavioral and emotional disorders with onset usually occurring in childhood and adolescence: Secondary | ICD-10-CM

## 2021-12-31 MED ORDER — LISDEXAMFETAMINE DIMESYLATE 60 MG PO CAPS: 60.0000 mg | ORAL_CAPSULE | Freq: Every day | ORAL | 0 refills | Status: DC

## 2022-01-09 ENCOUNTER — Ambulatory Visit: Payer: BC Managed Care – PPO | Admitting: Nurse Practitioner

## 2022-01-09 ENCOUNTER — Encounter: Payer: Self-pay | Admitting: Nurse Practitioner

## 2022-01-09 VITALS — BP 140/90 | HR 87 | Temp 96.8°F | Wt 254.4 lb

## 2022-01-09 DIAGNOSIS — F411 Generalized anxiety disorder: Secondary | ICD-10-CM

## 2022-01-09 DIAGNOSIS — Z79899 Other long term (current) drug therapy: Secondary | ICD-10-CM

## 2022-01-09 DIAGNOSIS — F321 Major depressive disorder, single episode, moderate: Secondary | ICD-10-CM | POA: Diagnosis not present

## 2022-01-09 MED ORDER — FLUOXETINE HCL 20 MG PO CAPS
20.0000 mg | ORAL_CAPSULE | Freq: Every day | ORAL | 2 refills | Status: DC
Start: 2022-01-09 — End: 2022-04-30

## 2022-01-09 NOTE — Patient Instructions (Signed)

## 2022-01-09 NOTE — Progress Notes (Signed)
Assessment and Plan:  Margaret Mckinney was seen today for a one month follow up.  Diagnoses and all order for this visit:  1. Class 3 severe obesity with body mass index (BMI) of 40.0 to 44.9 in adult, unspecified obesity type, unspecified whether serious comorbidity present [facility]) Discussed appropriate BMI Goal of losing 1 lb per month. Diet modification. Physical activity. Encouraged/praised to build confidence.   2. Episode of moderate major depression (HCC) Continue Wellbutrin. Continue Fluoxetine.  - FLUoxetine (PROZAC) 20 MG capsule; Take 1 capsule (20 mg total) by mouth daily.  Dispense: 30 capsule; Refill: 2  3. Generalized anxiety disorder Continue Wellbutrin. Continue Fluoxetine. Discussed sleep hygiene. Well balance diet. Lifestyle modifications  - FLUoxetine (PROZAC) 20 MG capsule; Take 1 capsule (20 mg total) by mouth daily.  Dispense: 30 capsule; Refill: 2  4. Medication management All medications discussed and reviewed in full. All questions and concerns regarding medications addressed.    Patient is due for blood work.  Deferred today due to time.  She will return to clinic to have labs completed in 1-2 weeks.   Notify office for further evaluation and treatment, questions or concerns if s/s fail to improve. The risks and benefits of my recommendations, as well as other treatment options were discussed with the patient today. Questions were answered.  Further disposition pending results of labs. Discussed med's effects and SE's.    Over 20 minutes of exam, counseling, chart review, and critical decision making was performed.   Future Appointments  Date Time Provider Department Center  07/23/2022  3:00 PM Raynelle Dick, NP [facility] None    ------------------------------------------------------------------------------------------------------------------   HPI BP (!) 165/100   Pulse 87   Temp (!) 96.8 F (36 C)   Wt 254 lb 6.4 oz (115.4 kg)    SpO2 99%   BMI 43.67 kg/m   30 y.o.female presents for follow up on weight loss and medication effectiveness.  She has been working on lifestyle modifications including exercise after postpartum and continues to bee unsuccessful at weight loss, despite Phentermine.  However, she has lost 4 lb in the last month.    BMI is Body mass index is 43.67 kg/m., she has been working on diet and exercise. Wt Readings from Last 3 Encounters:  01/09/22 254 lb 6.4 oz (115.4 kg)  12/04/21 258 lb 9.6 oz (117.3 kg)  09/04/21 262 lb (118.8 kg)   Her mood has been stable on Wellbutrin and Prozac.   Past Medical History:  Diagnosis Date   ADD (attention deficit disorder)    Anxiety      Allergies  Allergen Reactions   Nsaids Hives and Other (See Comments)    blisters    Current Outpatient Medications on File Prior to Visit  Medication Sig   acetaminophen (TYLENOL) 500 MG tablet Take 2 tablets (1,000 mg total) by mouth every 6 (six) hours.   buPROPion (WELLBUTRIN XL) 300 MG 24 hr tablet Take 300 mg by mouth daily.   EPINEPHrine 0.3 mg/0.3 mL IJ SOAJ injection Inject 0.3 mg into the muscle daily as needed (allergic reaction).   FLUoxetine (PROZAC) 10 MG capsule Take 1 capsule (10 mg total) by mouth daily.   lisdexamfetamine (VYVANSE) 60 MG capsule Take 1 capsule (60 mg total) by mouth daily.   metFORMIN (GLUCOPHAGE) 500 MG tablet Take 1 tablet (500 mg total) by mouth 2 (two) times daily with a meal.   norethindrone-ethinyl estradiol-FE (LOESTRIN FE) 1-20 MG-MCG tablet Take 1 tablet by mouth daily.  phentermine (ADIPEX-P) 37.5 MG tablet Take 1/2 to 1 tablet every morning for dieting & weightloss (Patient not taking: Reported on 01/09/2022)   Semaglutide,0.25 or 0.5MG /DOS, (OZEMPIC, 0.25 OR 0.5 MG/DOSE,) 2 MG/3ML SOPN Inject 0.5 mg into the skin once a week. (Patient not taking: Reported on 01/09/2022)   WEGOVY 0.5 MG/0.5ML SOAJ INJECT 0.5 MG INTO THE SKIN ONCE A WEEK. (Patient not taking: Reported on  12/04/2021)   Current Facility-Administered Medications on File Prior to Visit  Medication   sodium citrate-citric acid (ORACIT) solution 30 mL    ROS: all negative except what is noted in the HPI.   Physical Exam:  BP (!) 165/100   Pulse 87   Temp (!) 96.8 F (36 C)   Wt 254 lb 6.4 oz (115.4 kg)   SpO2 99%   BMI 43.67 kg/m   General Appearance: NAD.  Awake, conversant and cooperative. Eyes: PERRLA, EOMs intact.  Sclera white.  Conjunctiva without erythema. Sinuses: No frontal/maxillary tenderness.  No nasal discharge. Nares patent.  ENT/Mouth: Ext aud canals clear.  Bilateral TMs w/DOL and without erythema or bulging. Hearing intact.  Posterior pharynx without swelling or exudate.  Tonsils without swelling or erythema.  Neck: Supple.  No masses, nodules or thyromegaly. Respiratory: Effort is regular with non-labored breathing. Breath sounds are equal bilaterally without rales, rhonchi, wheezing or stridor.  Cardio: RRR with no MRGs. Brisk peripheral pulses without edema.  Abdomen: Active BS in all four quadrants.  Soft and non-tender without guarding, rebound tenderness, hernias or masses. Lymphatics: Non tender without lymphadenopathy.  Musculoskeletal: Full ROM, 5/5 strength, normal ambulation.  No clubbing or cyanosis. Skin: Appropriate color for ethnicity. Warm without rashes, lesions, ecchymosis, ulcers.  Neuro: CN II-XII grossly normal. Normal muscle tone without cerebellar symptoms and intact sensation.   Psych: AO X 3,  appropriate mood and affect, insight and judgment.     Adela Glimpse, NP 9:39 AM [facility]

## 2022-01-23 ENCOUNTER — Other Ambulatory Visit: Payer: Self-pay | Admitting: Nurse Practitioner

## 2022-01-23 ENCOUNTER — Other Ambulatory Visit: Payer: BC Managed Care – PPO

## 2022-01-23 DIAGNOSIS — E559 Vitamin D deficiency, unspecified: Secondary | ICD-10-CM

## 2022-01-23 DIAGNOSIS — Z6837 Body mass index (BMI) 37.0-37.9, adult: Secondary | ICD-10-CM

## 2022-01-23 DIAGNOSIS — D649 Anemia, unspecified: Secondary | ICD-10-CM

## 2022-01-24 DIAGNOSIS — E782 Mixed hyperlipidemia: Secondary | ICD-10-CM | POA: Diagnosis not present

## 2022-01-24 DIAGNOSIS — E559 Vitamin D deficiency, unspecified: Secondary | ICD-10-CM | POA: Diagnosis not present

## 2022-01-24 DIAGNOSIS — R7309 Other abnormal glucose: Secondary | ICD-10-CM | POA: Diagnosis not present

## 2022-01-24 LAB — COMPLETE METABOLIC PANEL WITH GFR
AG Ratio: 1.2 (calc) (ref 1.0–2.5)
ALT: 34 U/L — ABNORMAL HIGH (ref 6–29)
AST: 23 U/L (ref 10–30)
Albumin: 4 g/dL (ref 3.6–5.1)
Alkaline phosphatase (APISO): 89 U/L (ref 31–125)
BUN/Creatinine Ratio: 12 (calc) (ref 6–22)
BUN: 13 mg/dL (ref 7–25)
CO2: 27 mmol/L (ref 20–32)
Calcium: 9.2 mg/dL (ref 8.6–10.2)
Chloride: 104 mmol/L (ref 98–110)
Creat: 1.05 mg/dL — ABNORMAL HIGH (ref 0.50–0.97)
Globulin: 3.3 g/dL (calc) (ref 1.9–3.7)
Glucose, Bld: 100 mg/dL — ABNORMAL HIGH (ref 65–99)
Potassium: 4.4 mmol/L (ref 3.5–5.3)
Sodium: 140 mmol/L (ref 135–146)
Total Bilirubin: 0.3 mg/dL (ref 0.2–1.2)
Total Protein: 7.3 g/dL (ref 6.1–8.1)
eGFR: 73 mL/min/{1.73_m2} (ref >=60)

## 2022-01-24 LAB — CBC WITH DIFFERENTIAL/PLATELET
Absolute Monocytes: 431 cells/uL (ref 200–950)
Basophils Absolute: 26 cells/uL (ref 0–200)
Basophils Relative: 0.3 %
Eosinophils Absolute: 123 cells/uL (ref 15–500)
Eosinophils Relative: 1.4 %
HCT: 40.6 % (ref 35.0–45.0)
Hemoglobin: 12.9 g/dL (ref 11.7–15.5)
Lymphs Abs: 1443 cells/uL (ref 850–3900)
MCH: 28.1 pg (ref 27.0–33.0)
MCHC: 31.8 g/dL — ABNORMAL LOW (ref 32.0–36.0)
MCV: 88.5 fL (ref 80.0–100.0)
MPV: 10.9 fL (ref 7.5–12.5)
Monocytes Relative: 4.9 %
Neutro Abs: 6776 cells/uL (ref 1500–7800)
Neutrophils Relative %: 77 %
Platelets: 403 10*3/uL — ABNORMAL HIGH (ref 140–400)
RBC: 4.59 10*6/uL (ref 3.80–5.10)
RDW: 13.1 % (ref 11.0–15.0)
Total Lymphocyte: 16.4 %
WBC: 8.8 10*3/uL (ref 3.8–10.8)

## 2022-01-24 LAB — HEMOGLOBIN A1C
Hgb A1c MFr Bld: 5.1 % of total Hgb (ref <5.7)
Mean Plasma Glucose: 100 mg/dL
eAG (mmol/L): 5.5 mmol/L

## 2022-01-24 LAB — VITAMIN D 25 HYDROXY (VIT D DEFICIENCY, FRACTURES): Vit D, 25-Hydroxy: 32 ng/mL (ref 30–100)

## 2022-01-24 LAB — LIPID PANEL
Cholesterol: 178 mg/dL (ref <200)
HDL: 73 mg/dL (ref >=50)
LDL Cholesterol (Calc): 77 mg/dL (calc)
Non-HDL Cholesterol (Calc): 105 mg/dL (calc) (ref <130)
Total CHOL/HDL Ratio: 2.4 (calc) (ref <5.0)
Triglycerides: 189 mg/dL — ABNORMAL HIGH (ref <150)

## 2022-01-24 LAB — TSH: TSH: 1.19 mIU/L

## 2022-01-30 ENCOUNTER — Encounter: Payer: Self-pay | Admitting: Nurse Practitioner

## 2022-02-03 ENCOUNTER — Other Ambulatory Visit: Payer: Self-pay | Admitting: Nurse Practitioner

## 2022-02-03 ENCOUNTER — Other Ambulatory Visit: Payer: Self-pay | Admitting: Internal Medicine

## 2022-02-03 DIAGNOSIS — F988 Other specified behavioral and emotional disorders with onset usually occurring in childhood and adolescence: Secondary | ICD-10-CM

## 2022-02-03 MED ORDER — LISDEXAMFETAMINE DIMESYLATE 60 MG PO CAPS: 60.0000 mg | ORAL_CAPSULE | Freq: Every day | ORAL | 0 refills | Status: DC

## 2022-02-03 MED ORDER — RYBELSUS 3 MG PO TABS
3.0000 mg | ORAL_TABLET | Freq: Every day | ORAL | 0 refills | Status: DC
Start: 2022-02-03 — End: 2022-07-02

## 2022-02-03 MED ORDER — BUPROPION HCL ER (XL) 300 MG PO TB24: 300.0000 mg | ORAL_TABLET | Freq: Every day | ORAL | 6 refills | Status: DC

## 2022-02-10 ENCOUNTER — Other Ambulatory Visit: Payer: Self-pay | Admitting: Nurse Practitioner

## 2022-02-10 ENCOUNTER — Encounter: Payer: Self-pay | Admitting: Nurse Practitioner

## 2022-02-10 ENCOUNTER — Telehealth: Payer: Self-pay

## 2022-02-10 DIAGNOSIS — F988 Other specified behavioral and emotional disorders with onset usually occurring in childhood and adolescence: Secondary | ICD-10-CM

## 2022-02-10 NOTE — Telephone Encounter (Signed)
Prior Authorization for [employer] complete and denied. Does not have a diagnosis of Diabetes.

## 2022-04-30 ENCOUNTER — Encounter: Payer: Self-pay | Admitting: Nurse Practitioner

## 2022-04-30 DIAGNOSIS — F411 Generalized anxiety disorder: Secondary | ICD-10-CM

## 2022-04-30 DIAGNOSIS — F321 Major depressive disorder, single episode, moderate: Secondary | ICD-10-CM

## 2022-04-30 MED ORDER — FLUOXETINE HCL 20 MG PO CAPS: 20.0000 mg | ORAL_CAPSULE | Freq: Two times a day (BID) | ORAL | 2 refills | Status: DC

## 2022-05-27 ENCOUNTER — Other Ambulatory Visit: Payer: Self-pay | Admitting: Nurse Practitioner

## 2022-05-27 DIAGNOSIS — F321 Major depressive disorder, single episode, moderate: Secondary | ICD-10-CM

## 2022-05-27 DIAGNOSIS — F411 Generalized anxiety disorder: Secondary | ICD-10-CM

## 2022-05-30 ENCOUNTER — Encounter: Payer: Self-pay | Admitting: Nurse Practitioner

## 2022-06-10 ENCOUNTER — Other Ambulatory Visit: Payer: Self-pay | Admitting: Nurse Practitioner

## 2022-06-10 DIAGNOSIS — F988 Other specified behavioral and emotional disorders with onset usually occurring in childhood and adolescence: Secondary | ICD-10-CM

## 2022-06-10 MED ORDER — LISDEXAMFETAMINE DIMESYLATE 60 MG PO CAPS: 60.0000 mg | ORAL_CAPSULE | Freq: Every day | ORAL | 0 refills | Status: DC

## 2022-07-02 ENCOUNTER — Other Ambulatory Visit: Payer: Self-pay | Admitting: Nurse Practitioner

## 2022-07-02 DIAGNOSIS — F988 Other specified behavioral and emotional disorders with onset usually occurring in childhood and adolescence: Secondary | ICD-10-CM

## 2022-07-02 MED ORDER — LISDEXAMFETAMINE DIMESYLATE 60 MG PO CAPS: 60.0000 mg | ORAL_CAPSULE | Freq: Every day | ORAL | 0 refills | Status: DC

## 2022-07-02 MED ORDER — WEGOVY 0.25 MG/0.5ML ~~LOC~~ SOAJ: 0.2500 mg | SUBCUTANEOUS | 1 refills | Status: DC

## 2022-07-14 ENCOUNTER — Telehealth: Payer: Self-pay

## 2022-07-14 NOTE — Telephone Encounter (Signed)
Prior Authorization completed and submitted to Covermymeds.   PA got denied This is not a covered benefit

## 2022-07-16 ENCOUNTER — Other Ambulatory Visit: Payer: Self-pay | Admitting: Nurse Practitioner

## 2022-07-16 DIAGNOSIS — F411 Generalized anxiety disorder: Secondary | ICD-10-CM

## 2022-07-16 DIAGNOSIS — F321 Major depressive disorder, single episode, moderate: Secondary | ICD-10-CM

## 2022-07-17 NOTE — Progress Notes (Deleted)
COMPLETE PHYSICAL  Assessment and Plan:  Envy was seen today for annual exam. Diagnoses and all orders for this visit:  Encounter for routine adult medical exam with abnormal findings Yearly  Attention deficit disorder, unspecified hyperactivity presence Has been out of medication Did well in past on Vyvanse but was no longer covered on insurance Would like to try Adderall again once she stops breast feeding  Former Smoker  Quit 05/28/20  Anxiety  Wellbutrin 150mg  XL doing well Has tried Lexapro and trintellix in the past. Discussed stress management techniques   Discussed good sleep hygiene Discussed increasing physical activity and exercise Increase water intake  Medication management Continued  Anemia, unspecified type Not taking supplementation at this time -    CBC  Screening for thyroid disorder -     TSH  Screening for diabetes mellitus -     Hemoglobin A1c  Screening for lipid disorders -     Lipid panel  Vitamin D deficiency Continue supplementation to maintain goal of 70-100 -     VITAMIN D 25 Hydroxy (Vit-D Deficiency, Fractures)  Screening for blood or protein in urine -     Urinalysis w microscopic - Micoralbumin/creatinine urine ratio  Morbid obesity (HCC) BMI 40.0-44.9, adult (HCC) Discussed dietary and exercise modifications - follow up 3 months for progress monitoring - increase veggies, decrease carbs - long discussion about weight loss, diet, and exercise      Discussed med's effects and SE's. Screening labs and tests as requested with regular follow-up as recommended. Over 40 minutes of face to face interview, exam, counseling, chart review, and complex, high level critical decision making was performed this visit.   HPI  31 y.o. female  presents for a complete physical.   She reports overall she is doing well. She is having occasional dizzy spells. She is on a multivitamin but is not taking Vit D or Vit B12 , uncertain if she  can take while breastfeeding.  She had a cesarean section delivery 05/27/21, she is 8 weeks postpartum and is currently nursing. She had a baby girl. She does notice she is irritable.   She has history of ADHD and has used Vyvanse in the past with positive results.  Has Tried Concerta and Adderall XR without positive results with her concentration.  Her blood pressure has been controlled at home, today their BP is   BP Readings from Last 3 Encounters:  01/09/22 140/90  12/04/21 122/80  09/04/21 126/80    She does not workout. She denies chest pain, shortness of breath, dizziness.   Weight management Establish with OB/GYN related to complex history , she is currently on Slynd (drosperinone ) 4mg .  BMI is There is no height or weight on file to calculate BMI., she is working on diet and exercise. Wt Readings from Last 3 Encounters:  01/09/22 254 lb 6.4 oz (115.4 kg)  12/04/21 258 lb 9.6 oz (117.3 kg)  09/04/21 262 lb (118.8 kg)   Has history of urticaria, has seen rheumatoid, weak positive ANA with RNP +, otherwise very throuogh work up, did trial of plaquenil without issues.. She has not had any hives since moving in with her mom and leaving her BF, less stress.  She has a history of anxiety. Doing well with wellbutrin 300 mg daily Dating someone now- has known x 15  Years.   She is not on B12 currently(taken off during pregnancy) but has a history of B12 deficiency Lab Results  Component Value Date  VITAMINB12 343 07/23/2021   She is not currently on Vit D supplementation, was taken off during pregnancy Last vitamin D Lab Results  Component Value Date   VD25OH 32 01/23/2022     PAP  2019 + high risk HPV, abnormal pap ASC-US- wants new GYN- has had leep procedure. Has seen [facility] in the past.  Normal Pap during pregnancy. 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   Current Medications:  Current Outpatient Medications on File Prior to Visit  Medication Sig Dispense Refill    acetaminophen (TYLENOL) 500 MG tablet Take 2 tablets (1,000 mg total) by mouth every 6 (six) hours. 30 tablet 0   buPROPion (WELLBUTRIN XL) 300 MG 24 hr tablet Take 1 tablet (300 mg total) by mouth daily. 30 tablet 6   EPINEPHrine 0.3 mg/0.3 mL IJ SOAJ injection Inject 0.3 mg into the muscle daily as needed (allergic reaction).     FLUoxetine (PROZAC) 20 MG capsule TAKE 1 CAPSULE BY MOUTH TWICE A DAY 180 capsule 2   lisdexamfetamine (VYVANSE) 60 MG capsule Take 1 capsule (60 mg total) by mouth daily. 30 capsule 0   metFORMIN (GLUCOPHAGE) 500 MG tablet Take 1 tablet (500 mg total) by mouth 2 (two) times daily with a meal. 60 tablet 11   norethindrone-ethinyl estradiol-FE (LOESTRIN FE) 1-20 MG-MCG tablet Take 1 tablet by mouth daily. 28 tablet 11   phentermine (ADIPEX-P) 37.5 MG tablet Take 1/2 to 1 tablet every morning for dieting & weightloss (Patient not taking: Reported on 01/09/2022) 30 tablet 1   Semaglutide-Weight Management (WEGOVY) 0.25 MG/0.5ML SOAJ Inject 0.25 mg into the skin once a week. 2 mL 1   Current Facility-Administered Medications on File Prior to Visit  Medication Dose Route Frequency Provider Last Rate Last Admin   sodium citrate-citric acid (ORACIT) solution 30 mL  30 mL Oral 30 min Pre-Op Amado Nash Candace Gallus, MD       Allergies:  Allergies  Allergen Reactions   Nsaids Hives and Other (See Comments)    blisters   Medical History:  She has Vitamin D deficiency; Tobacco use; BMI 37.0-37.9, adult; Abnormal Pap smear of cervix; Attention deficit disorder; Anemia - IDA w/ ABL; Cesarean delivery indicated due to breech presentation; Postpartum care following cesarean delivery 10/31; and Hx of anxiety disorder on their problem list.   Health Maintenance:   Immunization History  Administered Date(s) Administered   Influenza Inj Mdck Quad With Preservative 07/08/2017    Tetanus: Sept 2017 Pneumovax: N/A Prevnar 13: N/A Flu vaccine: needs this year Zostavax: N/A   No LMP  recorded. Pap: 2019 at GYN + HPV, ASCUS- needs new GYN, has had LEEP before MGM:At age 35, WNL  DEXA: N/A Colonoscopy: N/A EGD: N/A  Patient Care Team: Lucky Cowboy, MD as PCP - General (Internal Medicine)  Surgical History:  She has a past surgical history that includes Knee surgery (Left, 2006); Hand surgery (Right, 2019); LEEP (07/28/2017); and Cesarean section (N/A, 05/27/2021).   Family History:  Herfamily history includes Alopecia in her brother; Healthy in her father and mother; Rheum arthritis in her maternal aunt and maternal grandmother.   Social History:  She reports that she quit smoking about 2 years ago. Her smoking use included cigarettes. She has never used smokeless tobacco. She reports current alcohol use. She reports that she does not use drugs. She is smoking socially now, does not need it due to wellbutrin.   Review of Systems: Review of Systems  Constitutional: Negative.  Negative for chills and  fever.  HENT: Negative.  Negative for congestion, hearing loss, sinus pain, sore throat and tinnitus.   Eyes: Negative.  Negative for blurred vision and double vision.  Respiratory: Negative.  Negative for cough, hemoptysis, sputum production, shortness of breath and wheezing.   Cardiovascular:  Negative for chest pain, palpitations, orthopnea, claudication, leg swelling and PND.  Gastrointestinal: Negative.  Negative for abdominal pain, constipation, diarrhea, heartburn, nausea and vomiting.  Genitourinary: Negative.  Negative for dysuria and urgency.  Musculoskeletal:  Negative for back pain, falls, joint pain, myalgias and neck pain.  Skin:  Negative for itching and rash.  Neurological: Negative.  Negative for dizziness, tingling, tremors, weakness and headaches.  Endo/Heme/Allergies:  Does not bruise/bleed easily.  Psychiatric/Behavioral:  Negative for depression, hallucinations, memory loss, substance abuse and suicidal ideas. The patient is not nervous/anxious  and does not have insomnia.        ADD symptoms    Physical Exam: Estimated body mass index is 43.67 kg/m as calculated from the following:   Height as of 07/23/21: 5\' 4"  (1.626 m).   Weight as of 01/09/22: 254 lb 6.4 oz (115.4 kg). There were no vitals taken for this visit. General Appearance: Well nourished, in no apparent distress.  Eyes: PERRLA, EOMs, conjunctiva no swelling or erythema, normal fundi and vessels.  Sinuses: No Frontal/maxillary tenderness  ENT/Mouth: Ext aud canals clear, normal light reflex with TMs without erythema, bulging. Good dentition. No erythema, swelling, or exudate on post pharynx. Tonsils not swollen or erythematous. Hearing normal.  Neck: Supple, thyroid fullness but no nodules. No bruits  Respiratory: Respiratory effort normal, BS equal bilaterally without rales, rhonchi, wheezing or stridor.  Cardio: RRR without murmurs, rubs or gallops. Brisk peripheral pulses without edema.  Chest: symmetric, with normal excursions and percussion.  Breasts: defer Abdomen: Soft, nontender, no guarding, rebound, hernias, masses, or organomegaly.  Lymphatics: Non tender without lymphadenopathy.  Genitourinary: defer Musculoskeletal: Full ROM all peripheral extremities,5/5 strength, and normal gait.  Skin: Warm, dry without rashes, lesions, ecchymosis. Neuro: Cranial nerves intact, reflexes equal bilaterally. Normal muscle tone, no cerebellar symptoms. Sensation intact.  Psych: Awake and oriented X 3, normal affect, Insight and Judgment appropriate.   EKG: DEFER today   01/11/22, NP 12:37 PM [facility]

## 2022-07-23 ENCOUNTER — Encounter: Payer: Self-pay | Admitting: Nurse Practitioner

## 2022-07-23 DIAGNOSIS — Z131 Encounter for screening for diabetes mellitus: Secondary | ICD-10-CM

## 2022-07-23 DIAGNOSIS — Z79899 Other long term (current) drug therapy: Secondary | ICD-10-CM

## 2022-07-23 DIAGNOSIS — D649 Anemia, unspecified: Secondary | ICD-10-CM

## 2022-07-23 DIAGNOSIS — Z1389 Encounter for screening for other disorder: Secondary | ICD-10-CM

## 2022-07-23 DIAGNOSIS — Z1329 Encounter for screening for other suspected endocrine disorder: Secondary | ICD-10-CM

## 2022-07-23 DIAGNOSIS — F411 Generalized anxiety disorder: Secondary | ICD-10-CM

## 2022-07-23 DIAGNOSIS — Z0001 Encounter for general adult medical examination with abnormal findings: Secondary | ICD-10-CM

## 2022-07-23 DIAGNOSIS — Z13220 Encounter for screening for lipoid disorders: Secondary | ICD-10-CM

## 2022-07-23 DIAGNOSIS — Z87891 Personal history of nicotine dependence: Secondary | ICD-10-CM

## 2022-07-23 DIAGNOSIS — E559 Vitamin D deficiency, unspecified: Secondary | ICD-10-CM

## 2022-07-23 DIAGNOSIS — F988 Other specified behavioral and emotional disorders with onset usually occurring in childhood and adolescence: Secondary | ICD-10-CM

## 2022-08-05 ENCOUNTER — Other Ambulatory Visit: Payer: Self-pay | Admitting: Nurse Practitioner

## 2022-08-05 DIAGNOSIS — F988 Other specified behavioral and emotional disorders with onset usually occurring in childhood and adolescence: Secondary | ICD-10-CM

## 2022-08-05 MED ORDER — LISDEXAMFETAMINE DIMESYLATE 60 MG PO CAPS: 60.0000 mg | ORAL_CAPSULE | Freq: Every day | ORAL | 0 refills | Status: DC

## 2022-09-02 ENCOUNTER — Other Ambulatory Visit: Payer: Self-pay | Admitting: Nurse Practitioner

## 2022-09-02 DIAGNOSIS — F988 Other specified behavioral and emotional disorders with onset usually occurring in childhood and adolescence: Secondary | ICD-10-CM

## 2022-09-03 ENCOUNTER — Encounter: Payer: Self-pay | Admitting: Nurse Practitioner

## 2022-09-03 DIAGNOSIS — F988 Other specified behavioral and emotional disorders with onset usually occurring in childhood and adolescence: Secondary | ICD-10-CM

## 2022-09-03 MED ORDER — LISDEXAMFETAMINE DIMESYLATE 70 MG PO CAPS: 70.0000 mg | ORAL_CAPSULE | Freq: Every day | ORAL | 0 refills | Status: DC

## 2022-09-07 MED ORDER — LISDEXAMFETAMINE DIMESYLATE 70 MG PO CAPS: 70.0000 mg | ORAL_CAPSULE | Freq: Every day | ORAL | 0 refills | Status: DC

## 2022-09-23 DIAGNOSIS — J029 Acute pharyngitis, unspecified: Secondary | ICD-10-CM | POA: Diagnosis not present

## 2022-09-23 DIAGNOSIS — J0100 Acute maxillary sinusitis, unspecified: Secondary | ICD-10-CM | POA: Diagnosis not present

## 2022-09-23 DIAGNOSIS — Z6841 Body Mass Index (BMI) 40.0 and over, adult: Secondary | ICD-10-CM | POA: Diagnosis not present

## 2022-09-23 DIAGNOSIS — J209 Acute bronchitis, unspecified: Secondary | ICD-10-CM | POA: Diagnosis not present

## 2022-10-07 ENCOUNTER — Ambulatory Visit: Payer: BC Managed Care – PPO | Admitting: Nurse Practitioner

## 2022-10-07 ENCOUNTER — Encounter: Payer: Self-pay | Admitting: Nurse Practitioner

## 2022-10-07 VITALS — BP 118/76 | HR 96 | Temp 97.5°F | Ht 64.0 in | Wt 254.6 lb

## 2022-10-07 DIAGNOSIS — F411 Generalized anxiety disorder: Secondary | ICD-10-CM | POA: Diagnosis not present

## 2022-10-07 DIAGNOSIS — E782 Mixed hyperlipidemia: Secondary | ICD-10-CM

## 2022-10-07 DIAGNOSIS — Z79899 Other long term (current) drug therapy: Secondary | ICD-10-CM

## 2022-10-07 DIAGNOSIS — F321 Major depressive disorder, single episode, moderate: Secondary | ICD-10-CM

## 2022-10-07 DIAGNOSIS — Z789 Other specified health status: Secondary | ICD-10-CM

## 2022-10-07 MED ORDER — NORETHIN ACE-ETH ESTRAD-FE 1-20 MG-MCG PO TABS: 1.0000 | ORAL_TABLET | Freq: Every day | ORAL | 11 refills | Status: DC

## 2022-10-07 MED ORDER — ZEPBOUND 2.5 MG/0.5ML ~~LOC~~ SOAJ: 2.5000 mg | SUBCUTANEOUS | 2 refills | Status: DC

## 2022-10-07 NOTE — Progress Notes (Signed)
Assessment and Plan:  Margaret Mckinney was seen today for a one month follow up.  Diagnoses and all order for this visit:  Class 3 severe obesity with body mass index (BMI) of 40.0 to 44.9 in adult, unspecified obesity type, unspecified whether serious comorbidity present ([place]) Start Zepbound Discussed appropriate BMI Goal of losing 1 lb per month. Diet modification. Physical activity. Encouraged/praised to build confidence.  Episode of moderate major depression (HCC) Controlled Continue Wellbutrin. Continue Fluoxetine.  Generalized anxiety disorder Continue Wellbutrin. Continue Fluoxetine. Discussed sleep hygiene. Well balance diet. Lifestyle modifications  Medication management All medications discussed and reviewed in full. All questions and concerns regarding medications addressed.    Elevated triglycerides. Discussed lifestyle modifications. Recommended diet heavy in fruits and veggies, omega 3's. Decrease consumption of animal meats, cheeses, and dairy products. Remain active and exercise as tolerated. Continue to monitor. Check lipids  Birth Control Refilled  Continue safe sex practices  Orders Placed This Encounter  Procedures   CBC with Differential/Platelet   COMPLETE METABOLIC PANEL WITH GFR   Lipid panel    Notify office for further evaluation and treatment, questions or concerns if any reported s/s fail to improve.   The patient was advised to call back or seek an in-person evaluation if any symptoms worsen or if the condition fails to improve as anticipated.   Further disposition pending results of labs. Discussed med's effects and SE's.    I discussed the assessment and treatment plan with the patient. The patient was provided an opportunity to ask questions and all were answered. The patient agreed with the plan and demonstrated an understanding of the instructions.  Discussed med's effects and SE's. Screening labs and tests as requested with  regular follow-up as recommended.  I provided 25 minutes of face-to-face time during this encounter including counseling, chart review, and critical decision making was preformed.  Today's Plan of Care is based on a patient-centered health care approach known as shared decision making - the decisions, tests and treatments allow for patient preferences and values to be balanced with clinical evidence.    Future Appointments  Date Time Provider [place]  07/27/2023  3:00 PM Alycia Rossetti, NP [facility] None    ------------------------------------------------------------------------------------------------------------------   HPI BP 118/76   Pulse 96   Temp (!) 97.5 F (36.4 C)   Ht '5\' 4"'$  (1.626 m)   Wt 254 lb 9.6 oz (115.5 kg)   SpO2 99%   BMI 43.70 kg/m   32 y.o.female presents for follow up on weight loss and medication effectiveness.  She has been working on lifestyle modifications including exercise after postpartum and continues to bee unsuccessful at weight loss, despite Phentermine.     She is walking daily for 20-30 minutes.  She has cut out sodas and alcohol.   BMI is Body mass index is 43.7 kg/m., she has been working on diet and exercise. Wt Readings from Last 3 Encounters:  10/07/22 254 lb 9.6 oz (115.5 kg)  01/09/22 254 lb 6.4 oz (115.4 kg)  12/04/21 258 lb 9.6 oz (117.3 kg)   Her mood has been stable on Wellbutrin and Prozac.   She is not on cholesterol medication. Her cholesterol is not at goal. The cholesterol last visit was:   Lab Results  Component Value Date   CHOL 178 01/23/2022   HDL 73 01/23/2022   LDLCALC 77 01/23/2022   TRIG 189 (H) 01/23/2022   CHOLHDL 2.4 01/23/2022     Past Medical History:  Diagnosis Date   ADD (attention deficit disorder)    Anxiety      Allergies  Allergen Reactions   Nsaids Hives and Other (See Comments)    blisters    Current Outpatient Medications on File Prior to Visit  Medication Sig    acetaminophen (TYLENOL) 500 MG tablet Take 2 tablets (1,000 mg total) by mouth every 6 (six) hours.   buPROPion (WELLBUTRIN XL) 300 MG 24 hr tablet Take 1 tablet (300 mg total) by mouth daily.   EPINEPHrine 0.3 mg/0.3 mL IJ SOAJ injection Inject 0.3 mg into the muscle daily as needed (allergic reaction).   FLUoxetine (PROZAC) 20 MG capsule TAKE 1 CAPSULE BY MOUTH TWICE A DAY   lisdexamfetamine (VYVANSE) 70 MG capsule Take 1 capsule (70 mg total) by mouth daily.   metFORMIN (GLUCOPHAGE) 500 MG tablet Take 1 tablet (500 mg total) by mouth 2 (two) times daily with a meal.   phentermine (ADIPEX-P) 37.5 MG tablet Take 1/2 to 1 tablet every morning for dieting & weightloss (Patient not taking: Reported on 01/09/2022)   Semaglutide-Weight Management (WEGOVY) 0.25 MG/0.5ML SOAJ Inject 0.25 mg into the skin once a week. (Patient not taking: Reported on 10/07/2022)   Current Facility-Administered Medications on File Prior to Visit  Medication   sodium citrate-citric acid (ORACIT) solution 30 mL    ROS: all negative except what is noted in the HPI.   Physical Exam:  BP 118/76   Pulse 96   Temp (!) 97.5 F (36.4 C)   Ht '5\' 4"'$  (1.626 m)   Wt 254 lb 9.6 oz (115.5 kg)   SpO2 99%   BMI 43.70 kg/m   General Appearance: NAD. Morbidly obese. Awake, conversant and cooperative. Eyes: PERRLA, EOMs intact.  Sclera white.  Conjunctiva without erythema. Sinuses: No frontal/maxillary tenderness.  No nasal discharge. Nares patent.  ENT/Mouth: Ext aud canals clear.  Bilateral TMs w/DOL and without erythema or bulging. Hearing intact.  Posterior pharynx without swelling or exudate.  Tonsils without swelling or erythema.  Neck: Supple.  No masses, nodules or thyromegaly. Respiratory: Effort is regular with non-labored breathing. Breath sounds are equal bilaterally without rales, rhonchi, wheezing or stridor.  Cardio: RRR with no MRGs. Brisk peripheral pulses without edema.  Abdomen: Active BS in all four  quadrants.  Soft and non-tender without guarding, rebound tenderness, hernias or masses. Lymphatics: Non tender without lymphadenopathy.  Musculoskeletal: Full ROM, 5/5 strength, normal ambulation.  No clubbing or cyanosis. Skin: Appropriate color for ethnicity. Warm without rashes, lesions, ecchymosis, ulcers.  Neuro: CN II-XII grossly normal. Normal muscle tone without cerebellar symptoms and intact sensation.   Psych: AO X 3,  appropriate mood and affect, insight and judgment.     Darrol Jump, NP 3:02 PM [facility]

## 2022-10-07 NOTE — Patient Instructions (Signed)

## 2022-10-08 LAB — CBC WITH DIFFERENTIAL/PLATELET
Absolute Monocytes: 409 cells/uL (ref 200–950)
Basophils Absolute: 27 cells/uL (ref 0–200)
Basophils Relative: 0.3 %
Eosinophils Absolute: 134 cells/uL (ref 15–500)
Eosinophils Relative: 1.5 %
HCT: 38.7 % (ref 35.0–45.0)
Hemoglobin: 12.7 g/dL (ref 11.7–15.5)
Lymphs Abs: 1566 cells/uL (ref 850–3900)
MCH: 27.6 pg (ref 27.0–33.0)
MCHC: 32.8 g/dL (ref 32.0–36.0)
MCV: 84.1 fL (ref 80.0–100.0)
MPV: 11.8 fL (ref 7.5–12.5)
Monocytes Relative: 4.6 %
Neutro Abs: 6764 cells/uL (ref 1500–7800)
Neutrophils Relative %: 76 %
Platelets: 320 10*3/uL (ref 140–400)
RBC: 4.6 10*6/uL (ref 3.80–5.10)
RDW: 12.8 % (ref 11.0–15.0)
Total Lymphocyte: 17.6 %
WBC: 8.9 10*3/uL (ref 3.8–10.8)

## 2022-10-08 LAB — COMPLETE METABOLIC PANEL WITH GFR
AG Ratio: 1.3 (calc) (ref 1.0–2.5)
ALT: 139 U/L — ABNORMAL HIGH (ref 6–29)
AST: 60 U/L — ABNORMAL HIGH (ref 10–30)
Albumin: 4.1 g/dL (ref 3.6–5.1)
Alkaline phosphatase (APISO): 69 U/L (ref 31–125)
BUN: 14 mg/dL (ref 7–25)
CO2: 25 mmol/L (ref 20–32)
Calcium: 9.8 mg/dL (ref 8.6–10.2)
Chloride: 103 mmol/L (ref 98–110)
Creat: 0.93 mg/dL (ref 0.50–0.97)
Globulin: 3.2 g/dL (calc) (ref 1.9–3.7)
Glucose, Bld: 88 mg/dL (ref 65–99)
Potassium: 4.7 mmol/L (ref 3.5–5.3)
Sodium: 138 mmol/L (ref 135–146)
Total Bilirubin: 0.5 mg/dL (ref 0.2–1.2)
Total Protein: 7.3 g/dL (ref 6.1–8.1)
eGFR: 85 mL/min/{1.73_m2} (ref >=60)

## 2022-10-08 LAB — LIPID PANEL
Cholesterol: 182 mg/dL (ref <200)
HDL: 76 mg/dL (ref >=50)
LDL Cholesterol (Calc): 88 mg/dL (calc)
Non-HDL Cholesterol (Calc): 106 mg/dL (calc) (ref <130)
Total CHOL/HDL Ratio: 2.4 (calc) (ref <5.0)
Triglycerides: 88 mg/dL (ref <150)

## 2022-10-10 ENCOUNTER — Other Ambulatory Visit: Payer: Self-pay | Admitting: Nurse Practitioner

## 2022-10-10 DIAGNOSIS — F988 Other specified behavioral and emotional disorders with onset usually occurring in childhood and adolescence: Secondary | ICD-10-CM

## 2022-10-12 MED ORDER — LISDEXAMFETAMINE DIMESYLATE 70 MG PO CAPS: 70.0000 mg | ORAL_CAPSULE | Freq: Every day | ORAL | 0 refills | Status: DC

## 2022-10-22 DIAGNOSIS — S41152A Open bite of left upper arm, initial encounter: Secondary | ICD-10-CM | POA: Diagnosis not present

## 2022-11-10 ENCOUNTER — Other Ambulatory Visit: Payer: Self-pay | Admitting: Nurse Practitioner

## 2022-11-10 DIAGNOSIS — F988 Other specified behavioral and emotional disorders with onset usually occurring in childhood and adolescence: Secondary | ICD-10-CM

## 2022-11-11 MED ORDER — LISDEXAMFETAMINE DIMESYLATE 70 MG PO CAPS: 70.0000 mg | ORAL_CAPSULE | Freq: Every day | ORAL | 0 refills | Status: DC

## 2022-12-09 ENCOUNTER — Other Ambulatory Visit: Payer: Self-pay | Admitting: Nurse Practitioner

## 2022-12-09 DIAGNOSIS — F988 Other specified behavioral and emotional disorders with onset usually occurring in childhood and adolescence: Secondary | ICD-10-CM

## 2022-12-11 MED ORDER — LISDEXAMFETAMINE DIMESYLATE 70 MG PO CAPS: 70.0000 mg | ORAL_CAPSULE | Freq: Every day | ORAL | 0 refills | Status: DC

## 2023-01-14 ENCOUNTER — Other Ambulatory Visit: Payer: Self-pay | Admitting: Nurse Practitioner

## 2023-01-14 DIAGNOSIS — F988 Other specified behavioral and emotional disorders with onset usually occurring in childhood and adolescence: Secondary | ICD-10-CM

## 2023-01-15 MED ORDER — LISDEXAMFETAMINE DIMESYLATE 70 MG PO CAPS
70.0000 mg | ORAL_CAPSULE | Freq: Every day | ORAL | 0 refills | Status: DC
Start: 2023-01-15 — End: 2023-02-17

## 2023-01-19 ENCOUNTER — Encounter: Payer: Self-pay | Admitting: Nurse Practitioner

## 2023-01-19 DIAGNOSIS — Z789 Other specified health status: Secondary | ICD-10-CM

## 2023-01-19 MED ORDER — NORETHIN ACE-ETH ESTRAD-FE 1-20 MG-MCG PO TABS
1.0000 | ORAL_TABLET | Freq: Every day | ORAL | 11 refills | Status: DC
Start: 2023-01-19 — End: 2023-12-18

## 2023-02-17 ENCOUNTER — Other Ambulatory Visit: Payer: Self-pay | Admitting: Nurse Practitioner

## 2023-02-17 DIAGNOSIS — F988 Other specified behavioral and emotional disorders with onset usually occurring in childhood and adolescence: Secondary | ICD-10-CM

## 2023-02-18 MED ORDER — LISDEXAMFETAMINE DIMESYLATE 70 MG PO CAPS: ORAL_CAPSULE | ORAL | 0 refills | Status: DC

## 2023-03-10 ENCOUNTER — Other Ambulatory Visit: Payer: Self-pay | Admitting: Nurse Practitioner

## 2023-03-10 ENCOUNTER — Telehealth: Payer: Self-pay | Admitting: Nurse Practitioner

## 2023-03-10 DIAGNOSIS — F988 Other specified behavioral and emotional disorders with onset usually occurring in childhood and adolescence: Secondary | ICD-10-CM

## 2023-03-10 MED ORDER — LISDEXAMFETAMINE DIMESYLATE 70 MG PO CAPS: ORAL_CAPSULE | ORAL | 0 refills | Status: DC

## 2023-03-10 NOTE — Telephone Encounter (Signed)
Pt said she requested a refill on Vyvanse a week or so ago, but she missed the pick up date, so she was wondering if you could send her in another script. [facility]s [facility].

## 2023-04-16 ENCOUNTER — Other Ambulatory Visit: Payer: Self-pay | Admitting: Nurse Practitioner

## 2023-04-16 DIAGNOSIS — F988 Other specified behavioral and emotional disorders with onset usually occurring in childhood and adolescence: Secondary | ICD-10-CM

## 2023-04-28 ENCOUNTER — Other Ambulatory Visit: Payer: Self-pay | Admitting: Nurse Practitioner

## 2023-04-28 DIAGNOSIS — F988 Other specified behavioral and emotional disorders with onset usually occurring in childhood and adolescence: Secondary | ICD-10-CM

## 2023-04-28 MED ORDER — LISDEXAMFETAMINE DIMESYLATE 70 MG PO CAPS
ORAL_CAPSULE | ORAL | 0 refills | Status: DC
Start: 2023-04-28 — End: 2023-05-01

## 2023-04-30 ENCOUNTER — Encounter: Payer: Self-pay | Admitting: Nurse Practitioner

## 2023-04-30 DIAGNOSIS — F988 Other specified behavioral and emotional disorders with onset usually occurring in childhood and adolescence: Secondary | ICD-10-CM

## 2023-05-01 MED ORDER — LISDEXAMFETAMINE DIMESYLATE 70 MG PO CAPS: ORAL_CAPSULE | ORAL | 0 refills | Status: DC

## 2023-05-10 ENCOUNTER — Other Ambulatory Visit: Payer: Self-pay | Admitting: Nurse Practitioner

## 2023-05-10 DIAGNOSIS — F411 Generalized anxiety disorder: Secondary | ICD-10-CM

## 2023-05-10 DIAGNOSIS — F321 Major depressive disorder, single episode, moderate: Secondary | ICD-10-CM

## 2023-06-04 ENCOUNTER — Other Ambulatory Visit: Payer: Self-pay | Admitting: Nurse Practitioner

## 2023-06-04 DIAGNOSIS — F988 Other specified behavioral and emotional disorders with onset usually occurring in childhood and adolescence: Secondary | ICD-10-CM

## 2023-06-04 MED ORDER — LISDEXAMFETAMINE DIMESYLATE 70 MG PO CAPS
ORAL_CAPSULE | ORAL | 0 refills | Status: DC
Start: 2023-06-04 — End: 2023-08-21

## 2023-06-18 ENCOUNTER — Ambulatory Visit: Payer: BC Managed Care – PPO

## 2023-07-27 ENCOUNTER — Encounter: Payer: BC Managed Care – PPO | Admitting: Nurse Practitioner

## 2023-07-27 NOTE — Progress Notes (Deleted)
COMPLETE PHYSICAL  Assessment and Plan:  Margaret Mckinney was seen today for annual exam. Diagnoses and all orders for this visit:  Encounter for routine adult medical exam with abnormal findings Yearly  Attention deficit disorder, unspecified hyperactivity presence Has been out of medication Did well in past on Vyvanse but was no longer covered on insurance Would like to try Adderall again once she stops breast feeding  Former Smoker  Quit 05/28/20  Anxiety / Depression Wellbutrin 150mg  XL doing well Has tried Lexapro and trintellix in the past. Discussed stress management techniques   Discussed good sleep hygiene Discussed increasing physical activity and exercise Increase water intake  Medication management Continued  Anemia, unspecified type Not taking supplementation at this time -    CBC - Iron/ferritin panel  Screening for thyroid disorder -     TSH  Abnormal Glucose Continue diet, exercise and weight loss -     Hemoglobin A1c  Elevated triglycerides Continue diet, exercise and weight loss -     Lipid panel  Vitamin D deficiency Continue supplementation to maintain goal of 70-100 -     VITAMIN D 25 Hydroxy (Vit-D Deficiency, Fractures)  Screening for blood or protein in urine -     Urinalysis w microscopic - Micoralbumin/creatinine urine ratio  Morbid obesity (HCC) BMI 40.0-44.9, adult (HCC) Discussed dietary and exercise modifications - follow up 3 months for progress monitoring - increase veggies, decrease carbs - long discussion about weight loss, diet, and exercise    Atypical squamous cells of undetermined significance on cytologic smear of cervix (ASC-US) Continues to follow with GYN   Discussed med's effects and SE's. Screening labs and tests as requested with regular follow-up as recommended. Over 40 minutes of face to face interview, exam, counseling, chart review, and complex, high level critical decision making was performed this visit.    HPI  32 y.o. female  presents for a complete physical.   She reports overall she is doing well. She is having occasional dizzy spells. She is on a multivitamin but is not taking Vit D or Vit B12 , uncertain if she can take while breastfeeding.  She had a cesarean section delivery 05/27/21. She had a baby girl. She does notice she is irritable.   She has history of ADD and has used Vyvanse in the past with positive results.  Has Tried Concerta and Adderall XR without positive results with her concentration.  Her blood pressure has been controlled at home, today their BP is   BP Readings from Last 3 Encounters:  10/07/22 118/76  01/09/22 140/90  12/04/21 122/80  She does not workout. She denies chest pain, shortness of breath, dizziness.    Establish with OB/GYN related to complex history , she is currently on Slynd (drosperinone ) 4mg . History of ASCUS PAP followed by GYN.  BMI is There is no height or weight on file to calculate BMI., she is working on diet and exercise. Wt Readings from Last 3 Encounters:  10/07/22 254 lb 9.6 oz (115.5 kg)  01/09/22 254 lb 6.4 oz (115.4 kg)  12/04/21 258 lb 9.6 oz (117.3 kg)   Has history of urticaria, has seen rheumatoid, weak positive ANA with RNP +, otherwise very throuogh work up, did trial of plaquenil without issues.. She has not had any hives since moving in with her mom and leaving her BF, less stress.  She has a history of anxiety. Doing well with wellbutrin 300 mg daily Dating someone now- has known x 15  Years.   She is not on B12 currently(taken off during pregnancy) but has a history of B12 deficiency Lab Results  Component Value Date   VITAMINB12 343 07/23/2021   She is not currently on Vit D supplementation, was taken off during pregnancy Last vitamin D Lab Results  Component Value Date   VD25OH 32 01/23/2022     PAP  2019 + high risk HPV, abnormal pap ASC-US- wants new GYN- has had leep procedure. Has seen [facility] in  the past.  Normal Pap during pregnancy. 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   Current Medications:  Current Outpatient Medications on File Prior to Visit  Medication Sig Dispense Refill   acetaminophen (TYLENOL) 500 MG tablet Take 2 tablets (1,000 mg total) by mouth every 6 (six) hours. 30 tablet 0   buPROPion (WELLBUTRIN XL) 300 MG 24 hr tablet Take 1 tablet (300 mg total) by mouth daily. 30 tablet 6   EPINEPHrine 0.3 mg/0.3 mL IJ SOAJ injection Inject 0.3 mg into the muscle daily as needed (allergic reaction).     FLUoxetine (PROZAC) 20 MG capsule TAKE 1 CAPSULE BY MOUTH TWICE A DAY 180 capsule 2   lisdexamfetamine (VYVANSE) 70 MG capsule Take  1 capsule  Daily  for ADD, Focus & Concentration                                                                                           /                                                                   TAKE                                         BY                                                 MOUTH 30 capsule 0   metFORMIN (GLUCOPHAGE) 500 MG tablet Take 1 tablet (500 mg total) by mouth 2 (two) times daily with a meal. 60 tablet 11   norethindrone-ethinyl estradiol-FE (LOESTRIN FE) 1-20 MG-MCG tablet Take 1 tablet by mouth daily. 28 tablet 11   Semaglutide-Weight Management (WEGOVY) 0.25 MG/0.5ML SOAJ Inject 0.25 mg into the skin once a week. (Patient not taking: Reported on 10/07/2022) 2 mL 1   Current Facility-Administered Medications on File Prior to Visit  Medication Dose Route Frequency Provider Last Rate Last Admin   sodium citrate-citric acid (ORACIT) solution 30 mL  30 mL Oral 30 min Pre-Op Amado Nash Candace Gallus, MD       Allergies:  Allergies  Allergen Reactions   Nsaids Hives and Other (See Comments)  blisters   Medical History:  She has Vitamin D deficiency; Tobacco use; BMI 37.0-37.9, adult; Abnormal Pap smear of cervix; Attention deficit disorder; Anemia - IDA w/ ABL; Cesarean delivery indicated due to breech presentation;  Postpartum care following cesarean delivery 10/31; and Hx of anxiety disorder on their problem list.   Health Maintenance:   Immunization History  Administered Date(s) Administered   Influenza Inj Mdck Quad With Preservative 07/08/2017    Tetanus: Sept 2017 Pneumovax: N/A Prevnar 13: N/A Flu vaccine: needs this year Zostavax: N/A   No LMP recorded. (Menstrual status: Oral contraceptives). Pap: 2019 at GYN + HPV, ASCUS- needs new GYN, has had LEEP before MGM:At age 85, WNL  DEXA: N/A Colonoscopy: N/A EGD: N/A  Patient Care Team: Lucky Cowboy, MD as PCP - General (Internal Medicine)  Surgical History:  She has a past surgical history that includes Knee surgery (Left, 2006); Hand surgery (Right, 2019); LEEP (07/28/2017); and Cesarean section (N/A, 05/27/2021).   Family History:  Herfamily history includes Alopecia in her brother; Healthy in her father and mother; Rheum arthritis in her maternal aunt and maternal grandmother.   Social History:  She reports that she quit smoking about 3 years ago. Her smoking use included cigarettes. She has never used smokeless tobacco. She reports current alcohol use. She reports that she does not use drugs. She is smoking socially now, does not need it due to wellbutrin.   Review of Systems: Review of Systems  Constitutional: Negative.  Negative for chills and fever.  HENT: Negative.  Negative for congestion, hearing loss, sinus pain, sore throat and tinnitus.   Eyes: Negative.  Negative for blurred vision and double vision.  Respiratory: Negative.  Negative for cough, hemoptysis, sputum production, shortness of breath and wheezing.   Cardiovascular:  Negative for chest pain, palpitations, orthopnea, claudication, leg swelling and PND.  Gastrointestinal: Negative.  Negative for abdominal pain, constipation, diarrhea, heartburn, nausea and vomiting.  Genitourinary: Negative.  Negative for dysuria and urgency.  Musculoskeletal:  Negative  for back pain, falls, joint pain, myalgias and neck pain.  Skin:  Negative for itching and rash.  Neurological: Negative.  Negative for dizziness, tingling, tremors, weakness and headaches.  Endo/Heme/Allergies:  Does not bruise/bleed easily.  Psychiatric/Behavioral:  Negative for depression, hallucinations, memory loss, substance abuse and suicidal ideas. The patient is not nervous/anxious and does not have insomnia.        ADD symptoms    Physical Exam: Estimated body mass index is 43.7 kg/m as calculated from the following:   Height as of 10/07/22: 5\' 4"  (1.626 m).   Weight as of 10/07/22: 254 lb 9.6 oz (115.5 kg). There were no vitals taken for this visit. General Appearance: Well nourished, in no apparent distress.  Eyes: PERRLA, EOMs, conjunctiva no swelling or erythema, normal fundi and vessels.  Sinuses: No Frontal/maxillary tenderness  ENT/Mouth: Ext aud canals clear, normal light reflex with TMs without erythema, bulging. Good dentition. No erythema, swelling, or exudate on post pharynx. Tonsils not swollen or erythematous. Hearing normal.  Neck: Supple, thyroid fullness but no nodules. No bruits  Respiratory: Respiratory effort normal, BS equal bilaterally without rales, rhonchi, wheezing or stridor.  Cardio: RRR without murmurs, rubs or gallops. Brisk peripheral pulses without edema.  Chest: symmetric, with normal excursions and percussion.  Breasts: defer Abdomen: Soft, nontender, no guarding, rebound, hernias, masses, or organomegaly.  Lymphatics: Non tender without lymphadenopathy.  Genitourinary: defer Musculoskeletal: Full ROM all peripheral extremities,5/5 strength, and normal gait.  Skin: Warm, dry  without rashes, lesions, ecchymosis. Neuro: Cranial nerves intact, reflexes equal bilaterally. Normal muscle tone, no cerebellar symptoms. Sensation intact.  Psych: Awake and oriented X 3, normal affect, Insight and Judgment appropriate.   EKG: DEFER today   Raynelle Dick, NP 6:17 AM [facility]

## 2023-08-21 ENCOUNTER — Other Ambulatory Visit: Payer: Self-pay | Admitting: Nurse Practitioner

## 2023-08-21 ENCOUNTER — Telehealth: Payer: BC Managed Care – PPO | Admitting: Family Medicine

## 2023-08-21 DIAGNOSIS — J069 Acute upper respiratory infection, unspecified: Secondary | ICD-10-CM

## 2023-08-21 DIAGNOSIS — F988 Other specified behavioral and emotional disorders with onset usually occurring in childhood and adolescence: Secondary | ICD-10-CM

## 2023-08-21 MED ORDER — FLUTICASONE PROPIONATE 50 MCG/ACT NA SUSP: 2.0000 | Freq: Every day | NASAL | 6 refills | Status: DC

## 2023-08-21 MED ORDER — BENZONATATE 200 MG PO CAPS
200.0000 mg | ORAL_CAPSULE | Freq: Two times a day (BID) | ORAL | 0 refills | Status: DC | PRN
Start: 2023-08-21 — End: 2023-11-17

## 2023-08-21 NOTE — Progress Notes (Signed)

## 2023-08-24 MED ORDER — LISDEXAMFETAMINE DIMESYLATE 70 MG PO CAPS: ORAL_CAPSULE | ORAL | 0 refills | Status: DC

## 2023-09-01 ENCOUNTER — Telehealth: Payer: BC Managed Care – PPO | Admitting: Physician Assistant

## 2023-09-01 DIAGNOSIS — J069 Acute upper respiratory infection, unspecified: Secondary | ICD-10-CM | POA: Diagnosis not present

## 2023-09-01 NOTE — Progress Notes (Signed)

## 2023-09-01 NOTE — Progress Notes (Signed)
 Message sent to patient requesting further input regarding current symptoms. Awaiting patient response.

## 2023-09-01 NOTE — Progress Notes (Signed)
 I have spent 5 minutes in review of e-visit questionnaire, review and updating patient chart, medical decision making and response to patient.   Piedad Climes, PA-C

## 2023-09-29 ENCOUNTER — Other Ambulatory Visit: Payer: Self-pay | Admitting: Nurse Practitioner

## 2023-09-29 DIAGNOSIS — F988 Other specified behavioral and emotional disorders with onset usually occurring in childhood and adolescence: Secondary | ICD-10-CM

## 2023-09-30 MED ORDER — LISDEXAMFETAMINE DIMESYLATE 70 MG PO CAPS: ORAL_CAPSULE | ORAL | 0 refills | Status: DC

## 2023-11-17 ENCOUNTER — Ambulatory Visit: Payer: BC Managed Care – PPO | Admitting: Physician Assistant

## 2023-11-17 ENCOUNTER — Encounter: Payer: Self-pay | Admitting: Physician Assistant

## 2023-11-17 VITALS — BP 110/80 | HR 88 | Temp 97.9°F | Ht 64.0 in | Wt 262.0 lb

## 2023-11-17 DIAGNOSIS — F32A Depression, unspecified: Secondary | ICD-10-CM

## 2023-11-17 DIAGNOSIS — E559 Vitamin D deficiency, unspecified: Secondary | ICD-10-CM

## 2023-11-17 DIAGNOSIS — E669 Obesity, unspecified: Secondary | ICD-10-CM

## 2023-11-17 DIAGNOSIS — Z91014 Allergy to mammalian meats: Secondary | ICD-10-CM

## 2023-11-17 DIAGNOSIS — F988 Other specified behavioral and emotional disorders with onset usually occurring in childhood and adolescence: Secondary | ICD-10-CM | POA: Diagnosis not present

## 2023-11-17 DIAGNOSIS — E538 Deficiency of other specified B group vitamins: Secondary | ICD-10-CM | POA: Diagnosis not present

## 2023-11-17 DIAGNOSIS — E785 Hyperlipidemia, unspecified: Secondary | ICD-10-CM

## 2023-11-17 DIAGNOSIS — F419 Anxiety disorder, unspecified: Secondary | ICD-10-CM

## 2023-11-17 MED ORDER — LISDEXAMFETAMINE DIMESYLATE 70 MG PO CAPS: 70.0000 mg | ORAL_CAPSULE | Freq: Every day | ORAL | 0 refills | Status: AC

## 2023-11-17 MED ORDER — FLUOXETINE HCL 40 MG PO CAPS: 40.0000 mg | ORAL_CAPSULE | Freq: Every day | ORAL | 3 refills | Status: AC

## 2023-11-17 MED ORDER — BUPROPION HCL ER (XL) 150 MG PO TB24: 150.0000 mg | ORAL_TABLET | Freq: Every day | ORAL | 1 refills | Status: DC

## 2023-11-17 MED ORDER — TIRZEPATIDE-WEIGHT MANAGEMENT 2.5 MG/0.5ML ~~LOC~~ SOLN: 2.5000 mg | SUBCUTANEOUS | 2 refills | Status: AC

## 2023-11-17 NOTE — Progress Notes (Signed)
 Margaret Mckinney is a 33 y.o. female here for to establish care.  History of Present Illness:   Chief Complaint  Patient presents with   Establish Care   Anxiety/Depression   ADD    Needs refill for Vyvanse  70 mg    Former PCP:  Dr. Vangie Genet  Relevant medical history: - Pt reports hx of fluid retention and pregnancy-induced hypertension during pregnancy.  - Was taking vitamin d  supplementation, Ergocalciferol , once weekly for a short period prior to pregnancy.  - Pt reports a hx of low vitamin B12 levels, but does not receive B12 injections.   Chronic issues: Anxiety / Depression: Pt is on Prozac  20 mg BID; in the morning and at lunch.  She has been on this regimen for about 1.5 years.  She did not take Prozac  while pregnant.  She does not notice significant clinical effects when taking Prozac , but can tell when she does not take it.  She previously took Wellbutrin  with Prozac , but stopped taking Wellbutrin  d/t pt not being able to get a refill.  Pt was on 300 mg of Wellbutrin  at the time.  She reports initially starting Wellbutrin  before Prozac .  Denies any SI/HI.   ADD: Pt is on Vyvanse  70 mg and has been on this dose for over a year.  She is not taking daily or on weekends.  She does report mid-afternoon crashes after work.  Good tolerance; denies any cardiac issues or adverse side effects.  Pt has previously tried taking Ritalin , Concerta , and Adderall and reports increased irritability with these medications.  She state Vyvanse  is also the only medication she has not had appetite changes with.  Denies any cardiac issues with Vyvanse .   Alpha-Gal Syndrome (AGS): Pt reports getting bit by an alpha tick in late 2018.  At the time she had severe swelling, hives, and states he skin "cracked to the point it burned".  Her condition is currently stable and well managed with diet changes and avoiding trigger foods.  She avoids deer meat, stating her throat gets itchy when  she does eat it.  She typically eats chicken, fish, and occasionally has steak.   Acute concerns: Weight management: Pt reports struggling with weight loss post-partum. Reports a peak weight of 290 lbs.  She is interested in trying injectables. Tried Wegovy  in the past and did well but insurance did not cover No hx of PCOS.   Past Medical History:  Diagnosis Date   ADD (attention deficit disorder)    Allergy 2015   Alphagal   Anxiety    Depression      Social History   Tobacco Use   Smoking status: Former    Current packs/day: 0.00    Average packs/day: 0.3 packs/day for 4.0 years (1.0 ttl pk-yrs)    Types: Cigarettes, E-cigarettes    Quit date: 05/28/2020    Years since quitting: 3.4   Smokeless tobacco: Never  Vaping Use   Vaping status: Some Days  Substance Use Topics   Alcohol use: Yes    Alcohol/week: 10.0 standard drinks of alcohol    Types: 10 Cans of beer per week    Comment: occasional   Drug use: No    Past Surgical History:  Procedure Laterality Date   CESAREAN SECTION N/A 05/27/2021   Procedure: Primary CESAREAN SECTION;  Surgeon: Zora Hires, MD;  Location: [facility] LD ORS;  Service: Obstetrics;  Laterality: N/A;  EDD: 06/02/21   FRACTURE SURGERY  2009 & 2019  Knee surgery & broken hand   HAND SURGERY Right 2019   KNEE SURGERY Left 2006   LEEP  07/28/2017    Family History  Problem Relation Age of Onset   Healthy Mother    Healthy Father    Alopecia Brother    Rheum arthritis Maternal Aunt    Rheum arthritis Maternal Grandmother    Arthritis Maternal Grandmother     Allergies  Allergen Reactions   Nsaids Hives and Other (See Comments)    blisters    Current Medications:   Current Outpatient Medications:    buPROPion  (WELLBUTRIN  XL) 150 MG 24 hr tablet, Take 1 tablet (150 mg total) by mouth daily. After 1 month, may increase to 2 tablets daily., Disp: 90 tablet, Rfl: 1   EPINEPHrine  0.3 mg/0.3 mL IJ SOAJ injection, Inject 0.3 mg into  the muscle daily as needed (allergic reaction)., Disp: , Rfl:    FLUoxetine  (PROZAC ) 40 MG capsule, Take 1 capsule (40 mg total) by mouth daily., Disp: 90 capsule, Rfl: 3   lisdexamfetamine (VYVANSE ) 70 MG capsule, Take 1 capsule (70 mg total) by mouth daily before breakfast., Disp: 30 capsule, Rfl: 0   [START ON 12/17/2023] lisdexamfetamine (VYVANSE ) 70 MG capsule, Take 1 capsule (70 mg total) by mouth daily before breakfast., Disp: 30 capsule, Rfl: 0   [START ON 01/16/2024] lisdexamfetamine (VYVANSE ) 70 MG capsule, Take 1 capsule (70 mg total) by mouth daily before breakfast., Disp: 30 capsule, Rfl: 0   norethindrone-ethinyl estradiol-FE (LOESTRIN FE) 1-20 MG-MCG tablet, Take 1 tablet by mouth daily., Disp: 28 tablet, Rfl: 11   tirzepatide  (ZEPBOUND ) 2.5 MG/0.5ML injection vial, Inject 2.5 mg into the skin once a week., Disp: 2 mL, Rfl: 2 No current facility-administered medications for this visit.  Facility-Administered Medications Ordered in Other Visits:    sodium citrate-citric acid  (ORACIT) solution 30 mL, 30 mL, Oral, 30 min Pre-Op, Almquist, Burlene Carpen, MD   Review of Systems:   Negative unless otherwise specified per HPI.  Vitals:   Vitals:   11/17/23 1343  BP: 110/80  Pulse: 88  Temp: 97.9 F (36.6 C)  TempSrc: Temporal  SpO2: 96%  Weight: 262 lb (118.8 kg)  Height: 5\' 4"  (1.626 m)     Body mass index is 44.97 kg/m.  Physical Exam:   Physical Exam Vitals and nursing note reviewed.  Constitutional:      General: She is not in acute distress.    Appearance: She is well-developed. She is not ill-appearing or toxic-appearing.  Cardiovascular:     Rate and Rhythm: Normal rate and regular rhythm.     Pulses: Normal pulses.     Heart sounds: Normal heart sounds, S1 normal and S2 normal.  Pulmonary:     Effort: Pulmonary effort is normal.     Breath sounds: Normal breath sounds.  Skin:    General: Skin is warm and dry.  Neurological:     Mental Status: She is alert.      GCS: GCS eye subscore is 4. GCS verbal subscore is 5. GCS motor subscore is 6.  Psychiatric:        Speech: Speech normal.        Behavior: Behavior normal. Behavior is cooperative.     Assessment and Plan:   1. Anxiety and depression (Primary) Controlled but room for improvement Will restart Wellbutrin  150 mg xl daily and may increase to 300 mg xl daily if desired Continue Prozac  40 mg in am Follow up in 3 month(s), sooner  if concerns - CBC with Differential/Platelet - Comprehensive metabolic panel with GFR  2. Attention deficit disorder, unspecified type Well controlled Continue Vyvanse  70 mg daily Follow up in 3 month(s), sooner if concerns  3. B12 deficiency - Vitamin B12  4. Vitamin D  deficiency - VITAMIN D  25 Hydroxy (Vit-D Deficiency, Fractures)  5. Alpha-gal syndrome Reviewed Denies any immediate/new concerns Declines epi-pen refill  6. Hyperlipidemia, unspecified hyperlipidemia type Update lipid panel and make recommendations  - Lipid panel  7. Obesity Will trial Zepbound  2.5 mg weekly vials Follow up in 3 months Risks and benefits/side effect(s) of medication discussed  I, Bernita Bristle, acting as a Neurosurgeon for Alexander Iba, [place]., have documented all relevant documentation on the behalf of Alexander Iba, [place], as directed by  Alexander Iba, PA while in the presence of Alexander Iba, [place].  I, Alexander Iba, [place], have reviewed all documentation for this visit. The documentation on 11/17/23 for the exam, diagnosis, procedures, and orders are all accurate and complete.  I spent a total of 50 minutes on this visit, today 11/17/23, which included reviewing previous notes from prior PCP, ordering tests, discussing plan of care with patient and using shared-decision making on next steps, refilling medications, and documenting the findings in the note.   Alexander Iba, PA-C

## 2023-11-17 NOTE — Patient Instructions (Signed)
 It was great to see you!  Restart Wellbutrin , increase after a month if needed Change Prozac  to 40 mg in am  Refill Vyvanse  today  2.5 mg weekly Zepbound  sent to [facility]  Let's follow-up in 3 months, sooner if you have concerns.  Take care,  Alexander Iba PA-C

## 2023-11-18 ENCOUNTER — Encounter: Payer: Self-pay | Admitting: Physician Assistant

## 2023-11-18 LAB — CBC WITH DIFFERENTIAL/PLATELET
Basophils Absolute: 0 10*3/uL (ref 0.0–0.1)
Basophils Relative: 0.6 % (ref 0.0–3.0)
Eosinophils Absolute: 0.1 10*3/uL (ref 0.0–0.7)
Eosinophils Relative: 1 % (ref 0.0–5.0)
HCT: 38.7 % (ref 36.0–46.0)
Hemoglobin: 12.9 g/dL (ref 12.0–15.0)
Lymphocytes Relative: 18.4 % (ref 12.0–46.0)
Lymphs Abs: 1.4 10*3/uL (ref 0.7–4.0)
MCHC: 33.4 g/dL (ref 30.0–36.0)
MCV: 88.8 fl (ref 78.0–100.0)
Monocytes Absolute: 0.3 10*3/uL (ref 0.1–1.0)
Monocytes Relative: 4.3 % (ref 3.0–12.0)
Neutro Abs: 5.8 10*3/uL (ref 1.4–7.7)
Neutrophils Relative %: 75.7 % (ref 43.0–77.0)
Platelets: 346 10*3/uL (ref 150.0–400.0)
RBC: 4.36 Mil/uL (ref 3.87–5.11)
RDW: 13.1 % (ref 11.5–15.5)
WBC: 7.7 10*3/uL (ref 4.0–10.5)

## 2023-11-18 LAB — LIPID PANEL
Cholesterol: 169 mg/dL (ref 0–200)
HDL: 70.4 mg/dL (ref >39.00)
LDL Cholesterol: 79 mg/dL (ref 0–99)
NonHDL: 98.41
Total CHOL/HDL Ratio: 2
Triglycerides: 96 mg/dL (ref 0.0–149.0)
VLDL: 19.2 mg/dL (ref 0.0–40.0)

## 2023-11-18 LAB — COMPREHENSIVE METABOLIC PANEL WITH GFR
ALT: 15 U/L (ref 0–35)
AST: 15 U/L (ref 0–37)
Albumin: 4 g/dL (ref 3.5–5.2)
Alkaline Phosphatase: 63 U/L (ref 39–117)
BUN: 15 mg/dL (ref 6–23)
CO2: 27 meq/L (ref 19–32)
Calcium: 9.2 mg/dL (ref 8.4–10.5)
Chloride: 102 meq/L (ref 96–112)
Creatinine, Ser: 0.89 mg/dL (ref 0.40–1.20)
GFR: 86.13 mL/min (ref >60.00)
Glucose, Bld: 87 mg/dL (ref 70–99)
Potassium: 4.4 meq/L (ref 3.5–5.1)
Sodium: 135 meq/L (ref 135–145)
Total Bilirubin: 0.3 mg/dL (ref 0.2–1.2)
Total Protein: 7.4 g/dL (ref 6.0–8.3)

## 2023-11-18 LAB — VITAMIN D 25 HYDROXY (VIT D DEFICIENCY, FRACTURES): VITD: 28.79 ng/mL — ABNORMAL LOW (ref 30.00–100.00)

## 2023-11-18 LAB — VITAMIN B12: Vitamin B-12: 459 pg/mL (ref 211–911)

## 2023-12-18 ENCOUNTER — Encounter: Payer: Self-pay | Admitting: Physician Assistant

## 2023-12-18 DIAGNOSIS — Z789 Other specified health status: Secondary | ICD-10-CM

## 2023-12-18 MED ORDER — BUPROPION HCL ER (XL) 300 MG PO TB24: 300.0000 mg | ORAL_TABLET | Freq: Every day | ORAL | 1 refills | Status: AC

## 2023-12-18 MED ORDER — NORETHIN ACE-ETH ESTRAD-FE 1-20 MG-MCG PO TABS: 1.0000 | ORAL_TABLET | Freq: Every day | ORAL | 5 refills | Status: DC

## 2023-12-18 NOTE — Telephone Encounter (Signed)
 Okay to fill Wellbutrin  XL 300 mg and fill OC Junel Fe 1-20 mg?

## 2024-02-16 ENCOUNTER — Ambulatory Visit: Admitting: Physician Assistant

## 2024-02-26 ENCOUNTER — Ambulatory Visit: Admitting: Physician Assistant

## 2024-03-08 ENCOUNTER — Ambulatory Visit: Admitting: Physician Assistant

## 2024-06-01 ENCOUNTER — Encounter: Payer: Self-pay | Admitting: Physician Assistant

## 2024-06-28 ENCOUNTER — Other Ambulatory Visit: Payer: Self-pay | Admitting: *Deleted

## 2024-06-28 DIAGNOSIS — Z789 Other specified health status: Secondary | ICD-10-CM

## 2024-06-28 MED ORDER — NORETHIN ACE-ETH ESTRAD-FE 1-20 MG-MCG PO TABS: 1.0000 | ORAL_TABLET | Freq: Every day | ORAL | 5 refills | Status: AC

## 2024-07-04 ENCOUNTER — Encounter: Payer: BC Managed Care – PPO | Admitting: Nurse Practitioner

## 2024-07-11 ENCOUNTER — Telehealth: Admitting: Physician Assistant

## 2024-07-11 DIAGNOSIS — J020 Streptococcal pharyngitis: Secondary | ICD-10-CM

## 2024-07-11 MED ORDER — AMOXICILLIN 500 MG PO CAPS: 500.0000 mg | ORAL_CAPSULE | Freq: Two times a day (BID) | ORAL | 0 refills | Status: AC

## 2024-07-11 NOTE — Progress Notes (Signed)

## 2024-07-12 ENCOUNTER — Telehealth: Admitting: Physician Assistant

## 2024-07-12 ENCOUNTER — Emergency Department (HOSPITAL_BASED_OUTPATIENT_CLINIC_OR_DEPARTMENT_OTHER)
Admission: EM | Admit: 2024-07-12 | Discharge: 2024-07-12 | Disposition: A | Discharge status: Home / Self Care | Source: Ambulatory Visit

## 2024-07-12 ENCOUNTER — Encounter (HOSPITAL_BASED_OUTPATIENT_CLINIC_OR_DEPARTMENT_OTHER): Payer: Self-pay | Admitting: Emergency Medicine

## 2024-07-12 ENCOUNTER — Other Ambulatory Visit: Payer: Self-pay

## 2024-07-12 DIAGNOSIS — Z79899 Other long term (current) drug therapy: Secondary | ICD-10-CM | POA: Diagnosis not present

## 2024-07-12 DIAGNOSIS — J029 Acute pharyngitis, unspecified: Secondary | ICD-10-CM | POA: Diagnosis not present

## 2024-07-12 DIAGNOSIS — R0981 Nasal congestion: Secondary | ICD-10-CM

## 2024-07-12 DIAGNOSIS — J039 Acute tonsillitis, unspecified: Secondary | ICD-10-CM

## 2024-07-12 DIAGNOSIS — R0989 Other specified symptoms and signs involving the circulatory and respiratory systems: Secondary | ICD-10-CM | POA: Diagnosis not present

## 2024-07-12 LAB — PREGNANCY, URINE: Preg Test, Ur: NEGATIVE

## 2024-07-12 LAB — GROUP A STREP BY PCR: Group A Strep by PCR: NOT DETECTED

## 2024-07-12 MED ORDER — PREDNISONE 20 MG PO TABS: 40.0000 mg | ORAL_TABLET | Freq: Every day | ORAL | 0 refills | Status: AC

## 2024-07-12 MED ORDER — DEXAMETHASONE SOD PHOSPHATE PF 10 MG/ML IJ SOLN
10.0000 mg | Freq: Once | INTRAMUSCULAR | Status: AC
  Administered 2024-07-12: 16:00:00 10 mg via INTRAMUSCULAR

## 2024-07-12 MED ORDER — LIDOCAINE VISCOUS HCL 2 % MT SOLN
15.0000 mL | Freq: Once | OROMUCOSAL | Status: AC
  Administered 2024-07-12: 15:00:00 15 mL via OROMUCOSAL
  Filled 2024-07-12: qty 15

## 2024-07-12 MED ORDER — FLUTICASONE PROPIONATE 50 MCG/ACT NA SUSP: 2.0000 | Freq: Every day | NASAL | 2 refills | Status: AC

## 2024-07-12 NOTE — Progress Notes (Signed)
°  Because of progressively worsening symptoms despite started treatment, I feel your condition warrants further evaluation and I recommend that you be seen in a face-to-face visit.   NOTE: There will be NO CHARGE for this E-Visit   If you are having a true medical emergency, please call 911.     For an urgent face to face visit, [place] has multiple urgent care centers for your convenience.  Click the link below for the full list of locations and hours, walk-in wait times, appointment scheduling options and driving directions:  Urgent Care - [place], [place], [place], [place], [place], [place]  [place]     Your MyChart E-visit questionnaire answers were reviewed by a board certified advanced clinical practitioner to complete your personal care plan based on your specific symptoms.    Thank you for using e-Visits.

## 2024-07-12 NOTE — Discharge Instructions (Signed)
 You were seen in the ER today for evaluation of your sore throat.  Testing unremarkable.  I think you likely still have strep or a viral illness.  I would still like you to continue taking the amoxicillin  twice a day for the next 10 days.  Please make sure that you throw away your toothbrush and use a new one for the next week and then for that would wait and get an A1c.we contaminate yourself.  This is contagious, please do not share any drinks or food as this can be contracted.  I recommend taking an 1000 mg of Tylenol  every 6 hours as needed for pain and fever.  I have also sent you home with some prednisone  which should help with your inflammation and pain.  You can also try the Flonase  nasal spray with your decongestion.  I have included some additional information for the discharge report for you to review.  I have also given you the information for an ear nose and throat specialist here to follow-up given your recurrence of this.  If you have any concerns, new or worsening symptoms, please return to your nearest emergency department for reevaluation.  Contact a doctor if: You have a fever for more than 2-3 days. You keep having symptoms for more than 2-3 days. Your throat does not get better in 7 days. You have a fever and your symptoms suddenly get worse. Your child who is 3 months to 65 years old has a temperature of 102.47F (39C) or higher. Get help right away if: You have trouble breathing. You cannot swallow fluids, soft foods, or your spit. You have swelling in your throat or neck that gets worse. You feel like you may vomit (nauseous) and this feeling lasts a long time. You cannot stop vomiting. These symptoms may be an emergency. Get help right away. Call your local emergency services (911 in the [place] Do not wait to see if the symptoms will go away. Do not drive yourself to the hospital.

## 2024-07-12 NOTE — ED Provider Notes (Signed)
 [facility] AT [facility] Provider Note   CSN: [id number] Arrival date & time: 07/12/24  1302     Patient presents with: Sore Throat   Margaret Mckinney is a 33 y.o. female with h/o depression presents the emerged from today for evaluation of sore throat.  She reports that she started to have a sore throat on Saturday and worsened on Sunday.  She took an over-the-counter strep test that was positive.  She had a telehealth visit and was prescribed amoxicillin .  She reports she has had 4 doses of this however was prescribed twice daily for 10 days.  She also ports that today she started having runny nose and nasal congestion.  Fever of 102.1 earlier.  Took Tylenol  around 11.  She denies any drooling or coughing.  Is having pain with swallowing but no difficulty of swallowing.  Denies any trouble breathing other than with her nasal congestion.  She denies any known sick contacts. Allergic to NSAIDs. Denies tobacco, EtOH, or drug use.   Sore Throat Pertinent negatives include no shortness of breath.       Prior to Admission medications  Medication Sig Start Date End Date Taking? Authorizing Provider  amoxicillin  (AMOXIL ) 500 MG capsule Take 1 capsule (500 mg total) by mouth 2 (two) times daily for 10 days. 07/11/24 07/21/24  Vivienne Delon HERO, PA-C  buPROPion  (WELLBUTRIN  XL) 300 MG 24 hr tablet Take 1 tablet (300 mg total) by mouth daily. 12/18/23   Job Lukes, PA  EPINEPHrine  0.3 mg/0.3 mL IJ SOAJ injection Inject 0.3 mg into the muscle daily as needed (allergic reaction).    [provider]  FLUoxetine  (PROZAC ) 40 MG capsule Take 1 capsule (40 mg total) by mouth daily. 11/17/23   Job Lukes, PA  lisdexamfetamine  (VYVANSE ) 70 MG capsule Take 1 capsule (70 mg total) by mouth daily before breakfast. 11/17/23 12/17/23  Job Lukes, PA  lisdexamfetamine  (VYVANSE ) 70 MG capsule Take 1 capsule (70 mg total) by mouth daily before breakfast.  12/17/23 01/16/24  Job Lukes, PA  lisdexamfetamine  (VYVANSE ) 70 MG capsule Take 1 capsule (70 mg total) by mouth daily before breakfast. 01/16/24 02/15/24  Job Lukes, PA  norethindrone-ethinyl estradiol-FE (LOESTRIN FE) 1-20 MG-MCG tablet Take 1 tablet by mouth daily. 06/28/24   Job Lukes, PA  tirzepatide  (ZEPBOUND ) 2.5 MG/0.5ML injection vial Inject 2.5 mg into the skin once a week. 11/17/23   Job Lukes, PA    Allergies: Nsaids    Review of Systems  Constitutional:  Positive for fever.  HENT:  Positive for congestion, rhinorrhea and sore throat.   Respiratory:  Negative for cough and shortness of breath.   Skin:  Negative for rash.    Updated Vital Signs BP (!) 142/78 (BP Location: Right Arm)   Pulse 91   Temp 98.7 F (37.1 C) (Oral)   Resp 18   SpO2 100%   Physical Exam Vitals and nursing note reviewed.  Constitutional:      General: She is not in acute distress.    Appearance: She is not ill-appearing or toxic-appearing.  HENT:     Right Ear: Tympanic membrane and ear canal normal. No tenderness.     Left Ear: Tympanic membrane and ear canal normal. No tenderness.     Nose:     Comments: Bilateral nasal turbinate edema with erythema and scant clear nasal discharge    Mouth/Throat:     Comments: Posterior oropharynx has some erythema without edema.  Has some white exudate  to the bilateral tonsils.  Tonsils are not edematous.  Uvula is midline airways patent.  Controlling secretions.  Normal phonation.  No sublingual elevation.  No trismus. Cardiovascular:     Rate and Rhythm: Normal rate.  Pulmonary:     Effort: Pulmonary effort is normal. No respiratory distress.     Breath sounds: Normal breath sounds. No stridor. No wheezing or rales.  Lymphadenopathy:     Cervical: No cervical adenopathy.  Skin:    General: Skin is warm and dry.  Neurological:     Mental Status: She is alert.     (all labs ordered are listed, but only abnormal results are  displayed) Labs Reviewed  GROUP A STREP BY PCR  PREGNANCY, URINE    EKG: None  Radiology: No results found.  Procedures   Medications Ordered in the ED  dexamethasone  (DECADRON ) injection 10 mg (has no administration in time range)  lidocaine  (XYLOCAINE ) 2 % viscous mouth solution 15 mL (15 mLs Mouth/Throat Given 07/12/24 1436)   Medical Decision Making Amount and/or Complexity of Data Reviewed Labs: ordered.  Risk Prescription drug management.   33 y.o. female presents to the ER for evaluation of sore throat. Differential diagnosis includes but is not limited to Viral pharyngitis, strep pharyngitis, dental caries/abscess, esophagitis, sinusitis, post nasal drip, reflux, angioedema, RTA/PTA, Ludwig's angina. Vital signs mildly elevated BP otherwise unremarkable. Physical exam as noted above.   Patient exam shows some bilateral tonsillar erythema with exudate.  No edema.  Controlling secretions.  No trismus.  Uvula is midline airways patent.  She has no stridor and has normal phonation.  Exam is not consistent with any peritonsillar abscess, do not think further CT imaging is needed at this time.  No similar elevation, doubt any Ludwick's.  Will order strep test here as well as pregnancy test.  Plan to giving patient some Decadron  to see if this helps with some of her sore sore throat.  Will also give her some doses lidocaine  here as well.  I independently reviewed and interpreted the patient's labs.  Strep negative.  Pregnancy test negative.  Patient was given viscous lidocaine  here as well as shot of Decadron .  Vital signs show mildly elevated blood pressure otherwise unremarkable.  She has normal phonation and again is controlling secretions.  She has only had a few doses of the amoxicillin , do feel that this needs additional time for it to work.  I have sent her in some Decadron  since she is allergic to NSAIDs so we will hopefully help with the pain and inflammation.  We discussed  strep precautions with toothbrushes, sharing drinks, etc.  We also discussed return precautions to the emergency department.  She is stable for discharge home.  We discussed the results of the labs/imaging. The plan is medications as prescribed. We discussed strict return precautions and red flag symptoms. The patient verbalized their understanding and agrees to the plan. The patient is stable and being discharged home in good condition.  Portions of this report may have been transcribed using voice recognition software. Every effort was made to ensure accuracy; however, inadvertent computerized transcription errors may be present.    Final diagnoses:  Sore throat  Nasal congestion    ED Discharge Orders          Ordered    predniSONE  (DELTASONE ) 20 MG tablet  Daily        07/12/24 1624    fluticasone  (FLONASE ) 50 MCG/ACT nasal spray  Daily  07/12/24 1624               Bernis Ernst, PA-C 07/12/24 1630    Neysa Caron PARAS, DO 07/13/24 1243

## 2024-07-12 NOTE — ED Triage Notes (Signed)
 Reports dx w/ strep on Sunday. Taking antibiotics. No improvement. Slight muffled voice.

## 2024-07-14 ENCOUNTER — Telehealth: Payer: Self-pay

## 2024-07-14 NOTE — Telephone Encounter (Signed)
 Transition Care Management Unsuccessful Follow-up Telephone Call  Date of discharge and from where:  07/12/24 [facility]  Attempts:  1st Attempt  Reason for unsuccessful TCM follow-up call:  Left voice message; LVM for patient to complete TOC call and schedule for ED follow up with PCP. Advised to call our office to schedule this appointment. If pt returns call please schedule pt accordingly.
